# Patient Record
Sex: Female | Born: 1950 | Race: White | Hispanic: No | Smoking: Former smoker
Health system: Southern US, Community
[De-identification: ages and names within clinical notes are randomized; demographics above are authoritative.]

## PROBLEM LIST (undated history)

## (undated) DIAGNOSIS — M069 Rheumatoid arthritis, unspecified: Secondary | ICD-10-CM

## (undated) DIAGNOSIS — M51369 Other intervertebral disc degeneration, lumbar region without mention of lumbar back pain or lower extremity pain: Secondary | ICD-10-CM

## (undated) DIAGNOSIS — G5700 Lesion of sciatic nerve, unspecified lower limb: Secondary | ICD-10-CM

## (undated) DIAGNOSIS — M5136 Other intervertebral disc degeneration, lumbar region: Secondary | ICD-10-CM

## (undated) HISTORY — DX: Lesion of sciatic nerve, unspecified lower limb: G57.00

## (undated) HISTORY — DX: Other intervertebral disc degeneration, lumbar region: M51.36

## (undated) HISTORY — PX: TONSILLECTOMY: SUR1361

## (undated) HISTORY — DX: Rheumatoid arthritis, unspecified: M06.9

## (undated) HISTORY — DX: Other intervertebral disc degeneration, lumbar region without mention of lumbar back pain or lower extremity pain: M51.369

---

## 1998-04-22 ENCOUNTER — Other Ambulatory Visit: Admission: RE | Admit: 1998-04-22 | Discharge: 1998-04-22 | Payer: Self-pay | Admitting: *Deleted

## 2006-06-07 ENCOUNTER — Ambulatory Visit (HOSPITAL_COMMUNITY): Admission: RE | Admit: 2006-06-07 | Discharge: 2006-06-07 | Payer: Self-pay

## 2006-08-17 ENCOUNTER — Other Ambulatory Visit: Admission: RE | Admit: 2006-08-17 | Discharge: 2006-08-17 | Payer: Self-pay | Admitting: Internal Medicine

## 2006-08-23 ENCOUNTER — Encounter: Admission: RE | Admit: 2006-08-23 | Discharge: 2006-08-23 | Payer: Self-pay | Admitting: Internal Medicine

## 2007-04-01 ENCOUNTER — Encounter: Admission: RE | Admit: 2007-04-01 | Discharge: 2007-04-01 | Payer: Self-pay | Admitting: Internal Medicine

## 2008-03-31 ENCOUNTER — Encounter: Admission: RE | Admit: 2008-03-31 | Discharge: 2008-03-31 | Payer: Self-pay | Admitting: Internal Medicine

## 2008-11-30 ENCOUNTER — Encounter: Admission: RE | Admit: 2008-11-30 | Discharge: 2008-11-30 | Payer: Self-pay | Admitting: Internal Medicine

## 2011-07-20 ENCOUNTER — Other Ambulatory Visit (HOSPITAL_COMMUNITY)
Admission: RE | Admit: 2011-07-20 | Discharge: 2011-07-20 | Disposition: A | Discharge status: Home / Self Care | Payer: PRIVATE HEALTH INSURANCE | Source: Ambulatory Visit | Attending: Internal Medicine | Admitting: Internal Medicine

## 2011-07-20 DIAGNOSIS — Z01419 Encounter for gynecological examination (general) (routine) without abnormal findings: Secondary | ICD-10-CM | POA: Insufficient documentation

## 2011-07-20 DIAGNOSIS — Z1159 Encounter for screening for other viral diseases: Secondary | ICD-10-CM | POA: Insufficient documentation

## 2011-07-21 ENCOUNTER — Other Ambulatory Visit: Payer: Self-pay | Admitting: Internal Medicine

## 2011-07-21 DIAGNOSIS — Z1231 Encounter for screening mammogram for malignant neoplasm of breast: Secondary | ICD-10-CM

## 2011-08-22 ENCOUNTER — Ambulatory Visit
Admission: RE | Admit: 2011-08-22 | Discharge: 2011-08-22 | Disposition: A | Discharge status: Home / Self Care | Payer: PRIVATE HEALTH INSURANCE | Source: Ambulatory Visit | Attending: Internal Medicine | Admitting: Internal Medicine

## 2011-08-22 DIAGNOSIS — Z1231 Encounter for screening mammogram for malignant neoplasm of breast: Secondary | ICD-10-CM

## 2011-08-29 ENCOUNTER — Other Ambulatory Visit: Payer: Self-pay | Admitting: Internal Medicine

## 2011-08-29 DIAGNOSIS — R928 Other abnormal and inconclusive findings on diagnostic imaging of breast: Secondary | ICD-10-CM

## 2011-08-31 ENCOUNTER — Other Ambulatory Visit: Payer: Self-pay | Admitting: Internal Medicine

## 2011-08-31 DIAGNOSIS — E049 Nontoxic goiter, unspecified: Secondary | ICD-10-CM

## 2011-09-07 ENCOUNTER — Ambulatory Visit
Admission: RE | Admit: 2011-09-07 | Discharge: 2011-09-07 | Disposition: A | Discharge status: Home / Self Care | Payer: PRIVATE HEALTH INSURANCE | Source: Ambulatory Visit | Attending: Internal Medicine | Admitting: Internal Medicine

## 2011-09-07 DIAGNOSIS — R928 Other abnormal and inconclusive findings on diagnostic imaging of breast: Secondary | ICD-10-CM

## 2011-09-12 ENCOUNTER — Ambulatory Visit
Admission: RE | Admit: 2011-09-12 | Discharge: 2011-09-12 | Disposition: A | Discharge status: Home / Self Care | Payer: PRIVATE HEALTH INSURANCE | Source: Ambulatory Visit | Attending: Internal Medicine | Admitting: Internal Medicine

## 2011-09-12 DIAGNOSIS — E049 Nontoxic goiter, unspecified: Secondary | ICD-10-CM

## 2012-07-25 ENCOUNTER — Other Ambulatory Visit: Payer: Self-pay | Admitting: Internal Medicine

## 2012-07-25 DIAGNOSIS — Z1231 Encounter for screening mammogram for malignant neoplasm of breast: Secondary | ICD-10-CM

## 2012-08-22 ENCOUNTER — Ambulatory Visit
Admission: RE | Admit: 2012-08-22 | Discharge: 2012-08-22 | Disposition: A | Discharge status: Home / Self Care | Payer: PRIVATE HEALTH INSURANCE | Source: Ambulatory Visit | Attending: Internal Medicine | Admitting: Internal Medicine

## 2012-08-22 DIAGNOSIS — Z1231 Encounter for screening mammogram for malignant neoplasm of breast: Secondary | ICD-10-CM

## 2013-04-08 ENCOUNTER — Other Ambulatory Visit: Payer: Self-pay | Admitting: Internal Medicine

## 2013-04-08 DIAGNOSIS — M25562 Pain in left knee: Secondary | ICD-10-CM

## 2013-10-01 ENCOUNTER — Other Ambulatory Visit: Payer: Self-pay

## 2013-10-01 DIAGNOSIS — Z1231 Encounter for screening mammogram for malignant neoplasm of breast: Secondary | ICD-10-CM

## 2013-12-09 ENCOUNTER — Ambulatory Visit
Admission: RE | Admit: 2013-12-09 | Discharge: 2013-12-09 | Disposition: A | Discharge status: Home / Self Care | Payer: PRIVATE HEALTH INSURANCE | Source: Ambulatory Visit

## 2013-12-09 DIAGNOSIS — Z1231 Encounter for screening mammogram for malignant neoplasm of breast: Secondary | ICD-10-CM

## 2015-08-31 ENCOUNTER — Other Ambulatory Visit (HOSPITAL_COMMUNITY)
Admission: RE | Admit: 2015-08-31 | Discharge: 2015-08-31 | Disposition: A | Discharge status: Home / Self Care | Payer: PRIVATE HEALTH INSURANCE | Source: Ambulatory Visit | Attending: Internal Medicine | Admitting: Internal Medicine

## 2015-08-31 DIAGNOSIS — Z01419 Encounter for gynecological examination (general) (routine) without abnormal findings: Secondary | ICD-10-CM | POA: Diagnosis not present

## 2015-09-15 ENCOUNTER — Other Ambulatory Visit: Payer: Self-pay

## 2015-09-15 DIAGNOSIS — Z1231 Encounter for screening mammogram for malignant neoplasm of breast: Secondary | ICD-10-CM

## 2015-11-02 ENCOUNTER — Ambulatory Visit
Admission: RE | Admit: 2015-11-02 | Discharge: 2015-11-02 | Disposition: A | Discharge status: Home / Self Care | Payer: 59 | Source: Ambulatory Visit

## 2015-11-02 DIAGNOSIS — Z1231 Encounter for screening mammogram for malignant neoplasm of breast: Secondary | ICD-10-CM

## 2016-11-29 ENCOUNTER — Encounter: Payer: Self-pay | Admitting: Podiatry

## 2016-11-29 ENCOUNTER — Ambulatory Visit (INDEPENDENT_AMBULATORY_CARE_PROVIDER_SITE_OTHER): Payer: 59 | Admitting: Podiatry

## 2016-11-29 VITALS — BP 139/76 | HR 71 | Resp 16 | Ht 65.5 in | Wt 155.0 lb

## 2016-11-29 DIAGNOSIS — B351 Tinea unguium: Secondary | ICD-10-CM | POA: Diagnosis not present

## 2016-11-29 NOTE — Progress Notes (Signed)
   Subjective:    Patient ID: Pamela Pena, female    DOB: 15-Jun-1951, 66 y.o.   MRN: [id number]  HPI Chief Complaint  Patient presents with  . Nail Problem    Right foot; 3rd toe; nail discoloration; pt stated, "Has had for 3 weeks and wants to get checked for nail fungus"      Review of Systems  All other systems reviewed and are negative.      Objective:   Physical Exam        Assessment & Plan:

## 2016-11-29 NOTE — Progress Notes (Signed)
Subjective:     Patient ID: Pamela Pena, female   DOB: 1950/12/23, 66 y.o.   MRN: [id number]  HPI patient presents with discoloration in the third nail right on the lateral side with localized changes present   Review of Systems  All other systems reviewed and are negative.      Objective:   Physical Exam  Constitutional: She is oriented to person, place, and time.  Cardiovascular: Intact distal pulses.   Musculoskeletal: Normal range of motion.  Neurological: She is oriented to person, place, and time.  Skin: Skin is warm.  Nursing note and vitals reviewed.  neurovascular status intact muscle strength was adequate range of motion within normal limits with patient found to have discoloration of the distal lateral side digit 3 right with no proximal edema erythema or drainage noted. 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

## 2016-12-01 ENCOUNTER — Ambulatory Visit: Payer: Self-pay | Admitting: Podiatry

## 2017-01-05 ENCOUNTER — Encounter (INDEPENDENT_AMBULATORY_CARE_PROVIDER_SITE_OTHER): Payer: 59 | Admitting: Ophthalmology

## 2017-01-05 DIAGNOSIS — H43813 Vitreous degeneration, bilateral: Secondary | ICD-10-CM

## 2017-01-05 DIAGNOSIS — H33302 Unspecified retinal break, left eye: Secondary | ICD-10-CM

## 2017-01-05 DIAGNOSIS — H33021 Retinal detachment with multiple breaks, right eye: Secondary | ICD-10-CM | POA: Diagnosis not present

## 2017-01-05 DIAGNOSIS — H35371 Puckering of macula, right eye: Secondary | ICD-10-CM | POA: Diagnosis not present

## 2017-01-10 ENCOUNTER — Ambulatory Visit (INDEPENDENT_AMBULATORY_CARE_PROVIDER_SITE_OTHER): Payer: 59 | Admitting: Ophthalmology

## 2017-01-10 DIAGNOSIS — H33302 Unspecified retinal break, left eye: Secondary | ICD-10-CM

## 2017-01-17 ENCOUNTER — Ambulatory Visit (INDEPENDENT_AMBULATORY_CARE_PROVIDER_SITE_OTHER): Payer: 59 | Admitting: Ophthalmology

## 2017-01-17 DIAGNOSIS — H33303 Unspecified retinal break, bilateral: Secondary | ICD-10-CM

## 2017-05-21 ENCOUNTER — Ambulatory Visit (INDEPENDENT_AMBULATORY_CARE_PROVIDER_SITE_OTHER): Payer: 59 | Admitting: Ophthalmology

## 2017-05-21 DIAGNOSIS — H35371 Puckering of macula, right eye: Secondary | ICD-10-CM

## 2017-05-21 DIAGNOSIS — H43813 Vitreous degeneration, bilateral: Secondary | ICD-10-CM

## 2017-05-21 DIAGNOSIS — H33303 Unspecified retinal break, bilateral: Secondary | ICD-10-CM

## 2018-01-22 ENCOUNTER — Other Ambulatory Visit: Payer: Self-pay | Admitting: Internal Medicine

## 2018-01-22 DIAGNOSIS — Z1231 Encounter for screening mammogram for malignant neoplasm of breast: Secondary | ICD-10-CM

## 2018-02-07 ENCOUNTER — Encounter: Payer: Self-pay | Admitting: Radiology

## 2018-02-07 ENCOUNTER — Ambulatory Visit
Admission: RE | Admit: 2018-02-07 | Discharge: 2018-02-07 | Disposition: A | Discharge status: Home / Self Care | Payer: 59 | Source: Ambulatory Visit | Attending: Internal Medicine | Admitting: Internal Medicine

## 2018-02-07 DIAGNOSIS — Z1231 Encounter for screening mammogram for malignant neoplasm of breast: Secondary | ICD-10-CM

## 2018-02-25 ENCOUNTER — Ambulatory Visit (INDEPENDENT_AMBULATORY_CARE_PROVIDER_SITE_OTHER): Payer: 59 | Admitting: Neurology

## 2018-02-25 ENCOUNTER — Encounter: Payer: Self-pay | Admitting: Neurology

## 2018-02-25 VITALS — BP 153/82 | HR 70 | Ht 65.5 in | Wt 160.8 lb

## 2018-02-25 DIAGNOSIS — G629 Polyneuropathy, unspecified: Secondary | ICD-10-CM

## 2018-02-25 NOTE — Progress Notes (Signed)
[facility]    Provider:  Dr Jaynee Eagles Referring Provider: Deland Pretty, MD, Dr. Suella Broad Primary Care Physician:  Deland Pretty, MD  CC:  foot numbness  HPI:  Pamela Pena is a 67 y.o. female here as a referral from Dr. Shelia Media and Dr. Nelva Bush for bilateral foot numbness. PMHx degeneration of lumbar disc, piriformis syndrome, sacroiliac joint pain, numbness of foot, pain in right and left knee, rheumatoid arthritis. She has been having sciatic problems for a long time, after she started seeing Dr. Nelva Bush she noticed the tips of her toes were numb noticed it in bed, didn't think anything of it started in January of this year. Started slowly progressing to the rest of the foot. Now all of her toes are numb and into the ball of the feet, symmetric. No pain just numb. Continous, nothing makes it better or worse. No burning, shooting, tingling. She sees Dr. Amil Amen for her RA and is on methotrexate weekly and daily prednisone. She has been on methotrexate for 15 years. No long-term antibiotics, new medication, exposure to toxins. No family history of neuropathy.  Reviewed notes, labs and imaging from outside physicians, which showed:   Reviewed referring physician notes.  She is a former smoker but currently a non-smoker 6 years of tobacco use, no alcohol intake, she is seen for back pain she has had right SI joint injections with some relief.  She takes a rare Vicodin or naproxen and Tylenol.  She does have upcoming bilateral knee surgery scheduled 4 weeks apart.  At last appointment she started complaining about bilateral foot numbness.  She denies that it is painful.  No family or personal history of neuropathy.  No numbness or tingling in her hands.  Negative straight leg raise on the left, negative crossed straight leg raise on the right, excellent reflexes at the ankles, strength is 5 out of 5.  She has ongoing right thigh pain due to multilevel degenerative disc disease, rheumatoid  arthritis, bilateral knee osteoarthritis, bilateral foot numbness.  Nerve conduction study was ordered but results are not available in the consult and unclear if performed, will request records on any emg/ncs performed.    Review of Systems: Patient complains of symptoms per HPI as well as the following symptoms: numbness. Pertinent negatives and positives per HPI. All others negative.   Social History   Socioeconomic History  . Marital status: Widowed    Spouse name: Not on file  . Number of children: 2  . Years of education: 32  . Highest education level: Not on file  Occupational History  . Not on file  Social Needs  . Financial resource strain: Not on file  . Food insecurity:    Worry: Not on file    Inability: Not on file  . Transportation needs:    Medical: Not on file    Non-medical: Not on file  Tobacco Use  . Smoking status: Former Smoker    Last attempt to quit: 1974    Years since quitting: 45.2  . Smokeless tobacco: Never Used  Substance and Sexual Activity  . Alcohol use: Not Currently    Comment: quit 1974  . Drug use: Not Currently    Comment: some use for 5 years after high school.   . Sexual activity: Not on file  Lifestyle  . Physical activity:    Days per week: Not on file    Minutes per session: Not on file  . Stress: Not on file  Relationships  . Social connections:    Talks on phone: Not on file    Gets together: Not on file    Attends religious service: Not on file    Active member of club or organization: Not on file    Attends meetings of clubs or organizations: Not on file    Relationship status: Not on file  . Intimate partner violence:    Fear of current or ex partner: Not on file    Emotionally abused: Not on file    Physically abused: Not on file    Forced sexual activity: Not on file  Other Topics Concern  . Not on file  Social History Narrative   Lives at home. Her mother lives with her and she is her caretaker   Right handed     Drinks 2-4 cups of caffeine daily    Family History  Problem Relation Age of Onset  . Breast cancer Maternal Aunt   . CAD Mother   . Cancer Father   . Neuropathy Neg Hx     Past Medical History:  Diagnosis Date  . Disc degeneration, lumbar   . Piriformis syndrome   . RA (rheumatoid arthritis) ([place])     Past Surgical History:  Procedure Laterality Date  . CESAREAN SECTION  1970    Current Outpatient Medications  Medication Sig Dispense Refill  . acetaminophen (TYLENOL) 650 MG CR tablet Take 1,300 mg by mouth 2 (two) times daily.    . Ascorbic Acid (VITAMIN C PO) Take 1 tablet by mouth daily.    Marland Kitchen b complex vitamins tablet Take 1 tablet by mouth daily.    . Biotin (BIOTIN 5000) 5 MG CAPS Take 1 capsule by mouth daily.    . Calcium Carbonate-Vitamin D (CALCIUM PLUS VITAMIN D PO) Take 1 tablet by mouth daily.    . cyanocobalamin 1000 MCG tablet Take 1,000 mcg by mouth 2 (two) times daily.    Marland Kitchen HYDROcodone-acetaminophen (NORCO/VICODIN) 5-325 MG tablet Take 1 tablet by mouth every 6 (six) hours as needed for moderate pain. Pt stated, "Takes approximately 1 tablet per month"    . methotrexate (RHEUMATREX) 2.5 MG tablet Take 10 mg by mouth once a week. Monday- 4 tablets in AM and 4 tablets in PM    . Multiple Vitamins-Minerals (MULTIVITAMIN ADULTS PO) Take 1 tablet by mouth 2 (two) times daily.     . naproxen (NAPROSYN) 500 MG tablet Take 500 mg by mouth 2 (two) times daily with a meal.     . Omega-3 Fatty Acids (FISH OIL) 1200 MG CPDR Take 1 tablet by mouth daily.    Marland Kitchen OVER THE COUNTER MEDICATION B-6 (P5P Pyridoxol 5 Phosphate)  OTC supplements    . OVER THE COUNTER MEDICATION K2 (K-7 Maenaquinone- 7)  OTC supplement    . OVER THE COUNTER MEDICATION Herbavision (Lutein, Zeaxanthin, Bilberry Extract)    . polyethylene glycol powder (GLYCOLAX/MIRALAX) powder     . predniSONE (DELTASONE) 5 MG tablet Take 5 mg by mouth daily.     . TURMERIC PO Take 1,800 mg by mouth 2 (two) times  daily.    Marland Kitchen zolpidem (AMBIEN) 5 MG tablet Take 5 mg by mouth at bedtime.      No current facility-administered medications for this visit.     Allergies as of 02/25/2018  . (No Known Allergies)    Vitals: BP (!) 153/82 (BP Location: Right Arm, Patient Position: Sitting)   Pulse 70   Ht 5' 5.5" (1.664  m)   Wt 160 lb 12.8 oz (72.9 kg)   BMI 26.35 kg/m  Last Weight:  Wt Readings from Last 1 Encounters:  02/25/18 160 lb 12.8 oz (72.9 kg)   Last Height:   Ht Readings from Last 1 Encounters:  02/25/18 5' 5.5" (1.664 m)   Physical exam: Exam: Gen: NAD, conversant, well nourised,  well groomed                     CV: RRR, no MRG. No Carotid Bruits. No peripheral edema, warm, nontender Eyes: Conjunctivae clear without exudates or hemorrhage MSK: high arches in feet Neuro: Detailed Neurologic Exam  Speech:    Speech is normal; fluent and spontaneous with normal comprehension.  Cognition:    The patient is oriented to person, place, and time;     recent and remote memory intact;     language fluent;     normal attention, concentration,     fund of knowledge Cranial Nerves:    The pupils are equal, round, and reactive to light. Attempted fundoscopic exam could not visualize due to small pupils. . Extraocular movements are intact. Trigeminal sensation is intact and the muscles of mastication are normal. The face is symmetric. The palate elevates in the midline. Hearing intact. Voice is normal. Shoulder shrug is normal. The tongue has normal motion without fasciculations.   Coordination:    Normal finger to nose and heel to shin.   Gait:    Heel-toe and tandem gait are normal.   Motor Observation:    No asymmetry, no atrophy, and no involuntary movements noted. Tone:    Normal muscle tone.    Posture:    Posture is normal. normal erect    Strength:    Strength is V/V in the upper and lower limbs.      Sensation: intact to LT. Decrease pp and temp distally in the feet,  intact proprioception and vibration     Reflex Exam:  DTR's:    Deep tendon reflexes in the upper and lower extremities are brisk bilaterally including AJs.   Toes:   Right equiv, left downgoing    Clonus:    Clonus is absent.       Assessment/Plan:   67 y.o. female here as a referral from Dr. Shelia Media and Dr. 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 PMHx degeneration of lumbar disc, piriformis syndrome, sacroiliac joint pain, numbness of foot, pain in right and left knee, rheumatoid arthritis. Risk factors include autoimmune disease and long-term use of methotrexate but she is compliant with vitamin B supplementation.  - Discussed the causes of peripheral neuropathy, the most common being diabetes which patient does not report having. About 20 million people in the [place] states have some form of peripheral neuropathy. This is a condition that develops as a result of damage to the peripheral nervous system. 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 There are multiple causes eluding metabolic, toxic, infectious and endocrine disorders, small vessel disease, autoimmune diseases, and others. We'll perform extensive serum neuropathy screening. She is already seen by Rheumatology so will not perform any rheumatologic testing, asked her to also follow up with Dr. Amil Amen for symptoms as well.  - don't feel emg/ncs would give [place] any additional info at this time and is normal in pure small fiber neuropathy but can consider in the future  Orders Placed This Encounter  Procedures  . Hemoglobin A1c  . Vitamin B1  . Methylmalonic acid, serum  . TSH  . B12 and Folate Panel  . Tissue transglutaminase, IgA  . Gliadin antibodies, serum  . Heavy metals, blood  . Vitamin B6  . Multiple Myeloma Panel (SPEP&IFE w/QIG)    . Comprehensive metabolic panel  . CBC   Cc: Dr. Shelia Media and Dr. Campbell Riches, [facility] [address] [place], [place] [address]  Phone [phone] Fax [phone]

## 2018-02-25 NOTE — Patient Instructions (Signed)
Peripheral Neuropathy Peripheral neuropathy is a type of nerve damage. It affects nerves that carry signals between the spinal cord and other parts of the body. These are called peripheral nerves. With peripheral neuropathy, one nerve or a group of nerves may be damaged. What are the causes? Many things can damage peripheral nerves. For some people with peripheral neuropathy, the cause is unknown. Some causes include:  Diabetes. This is the most common cause of peripheral neuropathy.  Injury to a nerve.  Pressure or stress on a nerve that lasts a long time.  Too little vitamin B. Alcoholism can lead to this.  Infections.  Autoimmune diseases, such as multiple sclerosis, Rheumatoid Arthritis and systemic lupus erythematosus.  Inherited nerve diseases.  Some medicines, such as cancer drugs.  Toxic substances, such as lead and mercury.  Too little blood flowing to the legs.  Kidney disease.  Thyroid disease.  What are the signs or symptoms? Different people have different symptoms. The symptoms you have will depend on which of your nerves is damaged. Common symptoms include:  Loss of feeling (numbness) in the feet and hands.  Tingling in the feet and hands.  Pain that burns.  Very sensitive skin.  Weakness.  Not being able to move a part of the body (paralysis).  Muscle twitching.  Clumsiness or poor coordination.  Loss of balance.  Not being able to control your bladder.  Feeling dizzy.  Sexual problems.  How is this diagnosed? Peripheral neuropathy is a symptom, not a disease. Finding the cause of peripheral neuropathy can be hard. To figure that out, your health care provider will take a medical history and do a physical exam. A neurological exam will also be done. This involves checking things affected by your brain, spinal cord, and nerves (nervous system). For example, your health care provider will check your reflexes, how you move, and what you can  feel. Other types of tests may also be ordered, such as:  Blood tests.  A test of the fluid in your spinal cord.  Imaging tests, such as CT scans or an MRI.  Electromyography (EMG). This test checks the nerves that control muscles.  Nerve conduction velocity tests. These tests check how fast messages pass through your nerves.  Nerve biopsy. A small piece of nerve is removed. It is then checked under a microscope.  How is this treated?  Medicine is often used to treat peripheral neuropathy. Medicines may include: ? Pain-relieving medicines. Prescription or over-the-counter medicine may be suggested. ? Antiseizure medicine. This may be used for pain. ? Antidepressants. These also may help ease pain from neuropathy. ? Lidocaine. This is a numbing medicine. You might wear a patch or be given a shot. ? Mexiletine. This medicine is typically used to help control irregular heart rhythms.  Surgery. Surgery may be needed to relieve pressure on a nerve or to destroy a nerve that is causing pain.  Physical therapy to help movement.  Assistive devices to help movement. Follow these instructions at home:  Only take over-the-counter or prescription medicines as directed by your health care provider. Follow the instructions carefully for any given medicines. Do not take any other medicines without first getting approval from your health care provider.  If you have diabetes, work closely with your health care provider to keep your blood sugar under control.  If you have numbness in your feet: ? Check every day for signs of injury or infection. Watch for redness, warmth, and swelling. ? Wear padded socks  and comfortable shoes. These help protect your feet.  Do not do things that put pressure on your damaged nerve.  Do not smoke. Smoking keeps blood from getting to damaged nerves.  Avoid or limit alcohol. Too much alcohol can cause a lack of B vitamins. These vitamins are needed for healthy  nerves.  Develop a good support system. Coping with peripheral neuropathy can be stressful. Talk to a mental health specialist or join a support group if you are struggling.  Follow up with your health care provider as directed. Contact a health care provider if:  You have new signs or symptoms of peripheral neuropathy.  You are struggling emotionally from dealing with peripheral neuropathy.  You have a fever. Get help right away if:  You have an injury or infection that is not healing.  You feel very dizzy or begin vomiting.  You have chest pain.  You have trouble breathing. This information is not intended to replace advice given to you by your health care provider. Make sure you discuss any questions you have with your health care provider. Document Released: 11/03/2002 Document Revised: 04/20/2016 Document Reviewed: 07/21/2013 Elsevier Interactive Patient Education  2017 [employer].

## 2018-02-28 ENCOUNTER — Telehealth: Payer: Self-pay | Admitting: *Deleted

## 2018-02-28 LAB — MULTIPLE MYELOMA PANEL, SERUM
ALBUMIN/GLOB SERPL: 1.8 — AB (ref 0.7–1.7)
ALPHA 1: 0.3 g/dL (ref 0.0–0.4)
ALPHA2 GLOB SERPL ELPH-MCNC: 0.6 g/dL (ref 0.4–1.0)
Albumin SerPl Elph-Mcnc: 4.1 g/dL (ref 2.9–4.4)
B-Globulin SerPl Elph-Mcnc: 1 g/dL (ref 0.7–1.3)
Gamma Glob SerPl Elph-Mcnc: 0.4 g/dL (ref 0.4–1.8)
Globulin, Total: 2.3 g/dL (ref 2.2–3.9)
IGA/IMMUNOGLOBULIN A, SERUM: 91 mg/dL (ref 87–352)
IGM (IMMUNOGLOBULIN M), SRM: 35 mg/dL (ref 26–217)
IgG (Immunoglobin G), Serum: 461 mg/dL — ABNORMAL LOW (ref 700–1600)

## 2018-02-28 LAB — HEMOGLOBIN A1C
ESTIMATED AVERAGE GLUCOSE: 103 mg/dL
HEMOGLOBIN A1C: 5.2 % (ref 4.8–5.6)

## 2018-02-28 LAB — METHYLMALONIC ACID, SERUM: METHYLMALONIC ACID: 74 nmol/L (ref 0–378)

## 2018-02-28 LAB — CBC
HEMOGLOBIN: 13.6 g/dL (ref 11.1–15.9)
Hematocrit: 43.1 % (ref 34.0–46.6)
MCH: 33.5 pg — AB (ref 26.6–33.0)
MCHC: 31.6 g/dL (ref 31.5–35.7)
MCV: 106 fL — ABNORMAL HIGH (ref 79–97)
Platelets: 252 10*3/uL (ref 150–379)
RBC: 4.06 x10E6/uL (ref 3.77–5.28)
RDW: 15.3 % (ref 12.3–15.4)
WBC: 6.2 10*3/uL (ref 3.4–10.8)

## 2018-02-28 LAB — COMPREHENSIVE METABOLIC PANEL
A/G RATIO: 2.6 — AB (ref 1.2–2.2)
ALBUMIN: 4.6 g/dL (ref 3.6–4.8)
ALT: 24 IU/L (ref 0–32)
AST: 28 IU/L (ref 0–40)
Alkaline Phosphatase: 94 IU/L (ref 39–117)
BILIRUBIN TOTAL: 0.3 mg/dL (ref 0.0–1.2)
BUN / CREAT RATIO: 24 (ref 12–28)
BUN: 17 mg/dL (ref 8–27)
CALCIUM: 9.8 mg/dL (ref 8.7–10.3)
CHLORIDE: 101 mmol/L (ref 96–106)
CO2: 20 mmol/L (ref 20–29)
Creatinine, Ser: 0.72 mg/dL (ref 0.57–1.00)
GFR, EST AFRICAN AMERICAN: 100 mL/min/{1.73_m2} (ref >59)
GFR, EST NON AFRICAN AMERICAN: 86 mL/min/{1.73_m2} (ref >59)
GLOBULIN, TOTAL: 1.8 g/dL (ref 1.5–4.5)
Glucose: 89 mg/dL (ref 65–99)
POTASSIUM: 4.5 mmol/L (ref 3.5–5.2)
Sodium: 145 mmol/L — ABNORMAL HIGH (ref 134–144)
TOTAL PROTEIN: 6.4 g/dL (ref 6.0–8.5)

## 2018-02-28 LAB — HEAVY METALS, BLOOD
ARSENIC: 6 ug/L (ref 2–23)
LEAD, BLOOD: 1 ug/dL (ref 0–4)
MERCURY: NOT DETECTED ug/L (ref 0.0–14.9)

## 2018-02-28 LAB — B12 AND FOLATE PANEL: Vitamin B-12: 1271 pg/mL — ABNORMAL HIGH (ref 232–1245)

## 2018-02-28 LAB — VITAMIN B6: VITAMIN B6: 99.3 ug/L — AB (ref 2.0–32.8)

## 2018-02-28 LAB — GLIADIN ANTIBODIES, SERUM
Antigliadin Abs, IgA: 3 units (ref 0–19)
GLIADIN IGG: 3 U (ref 0–19)

## 2018-02-28 LAB — TISSUE TRANSGLUTAMINASE, IGA

## 2018-02-28 LAB — VITAMIN B1: Thiamine: 202.5 nmol/L — ABNORMAL HIGH (ref 66.5–200.0)

## 2018-02-28 LAB — TSH: TSH: 0.369 u[IU]/mL — ABNORMAL LOW (ref 0.450–4.500)

## 2018-02-28 NOTE — Telephone Encounter (Signed)
Spoke with patient and informed her that her labs showed her vitamin B6 is elevated, and advised her taking too much B6 can be toxic. This RN asked if she is taking OTC B6. She is taking pyridoxal phosphate (form of B6) 50 mg daily. This RN advised Dr Lucia Gaskins stated to make sure it is less than 100 mg a day. Otherwise her labs are unremarkable. Patient asked what a high level could cause; this RN advised neuropathy. Patient asked if she should stop, has only been taking a few months. This RN suggested she could begin taking it every other day to see how she felt.  She agreed, stated she is seeing her PCP before she sees Dr Lucia Gaskins again and she'll ask that it be rechecked. She verbalized understanding, appreciation of call.

## 2018-04-26 ENCOUNTER — Encounter (INDEPENDENT_AMBULATORY_CARE_PROVIDER_SITE_OTHER): Payer: 59 | Admitting: Ophthalmology

## 2018-04-26 DIAGNOSIS — H43813 Vitreous degeneration, bilateral: Secondary | ICD-10-CM | POA: Diagnosis not present

## 2018-04-26 DIAGNOSIS — H33303 Unspecified retinal break, bilateral: Secondary | ICD-10-CM | POA: Diagnosis not present

## 2018-04-26 DIAGNOSIS — H3561 Retinal hemorrhage, right eye: Secondary | ICD-10-CM | POA: Diagnosis not present

## 2018-04-26 DIAGNOSIS — H35371 Puckering of macula, right eye: Secondary | ICD-10-CM

## 2018-05-02 ENCOUNTER — Encounter (INDEPENDENT_AMBULATORY_CARE_PROVIDER_SITE_OTHER): Payer: 59 | Admitting: Ophthalmology

## 2018-05-09 ENCOUNTER — Encounter (INDEPENDENT_AMBULATORY_CARE_PROVIDER_SITE_OTHER): Payer: 59 | Admitting: Ophthalmology

## 2018-06-03 ENCOUNTER — Encounter (INDEPENDENT_AMBULATORY_CARE_PROVIDER_SITE_OTHER): Payer: 59 | Admitting: Ophthalmology

## 2018-06-03 DIAGNOSIS — H43813 Vitreous degeneration, bilateral: Secondary | ICD-10-CM | POA: Diagnosis not present

## 2018-06-03 DIAGNOSIS — H35371 Puckering of macula, right eye: Secondary | ICD-10-CM | POA: Diagnosis not present

## 2018-06-03 DIAGNOSIS — H33303 Unspecified retinal break, bilateral: Secondary | ICD-10-CM

## 2018-06-06 ENCOUNTER — Other Ambulatory Visit: Payer: Self-pay

## 2018-06-06 ENCOUNTER — Encounter (HOSPITAL_COMMUNITY): Payer: Self-pay | Admitting: *Deleted

## 2018-06-07 ENCOUNTER — Encounter (HOSPITAL_COMMUNITY)
Admission: RE | Admit: 2018-06-07 | Discharge: 2018-06-07 | Disposition: A | Discharge status: Home / Self Care | Payer: 59 | Source: Ambulatory Visit | Attending: Orthopedic Surgery | Admitting: Orthopedic Surgery

## 2018-06-07 DIAGNOSIS — Z01812 Encounter for preprocedural laboratory examination: Secondary | ICD-10-CM | POA: Insufficient documentation

## 2018-06-07 DIAGNOSIS — M1711 Unilateral primary osteoarthritis, right knee: Secondary | ICD-10-CM | POA: Insufficient documentation

## 2018-06-07 LAB — SURGICAL PCR SCREEN
MRSA, PCR: NEGATIVE
STAPHYLOCOCCUS AUREUS: NEGATIVE

## 2018-06-07 LAB — ABO/RH: ABO/RH(D): O NEG

## 2018-06-07 NOTE — Progress Notes (Signed)
05/24/2018-Labs on chart from [facility]- CBC w/diff., CMP, PT/INR

## 2018-06-09 MED ORDER — SODIUM CHLORIDE 0.9 % IV SOLN
1000.0000 mg | INTRAVENOUS | Status: AC
  Administered 2018-06-10: 1000 mg via INTRAVENOUS
  Filled 2018-06-09: qty 1100

## 2018-06-09 NOTE — H&P (Signed)
TOTAL KNEE ADMISSION H&P  Patient is being admitted for right total knee arthroplasty.  Subjective:  Chief Complaint: Right knee primary OA / pain  HPI: Pamela Pena, 67 y.o. female, has a history of pain and functional disability in the right knee due to arthritis and has failed non-surgical conservative treatments for greater than 12 weeks to include NSAID's and/or analgesics, corticosteriod injections and activity modification. Onset of symptoms was gradual, starting 3-4 years ago with gradually worsening course since that time. The patient noted no past surgery on the right knee(s). Patient currently rates pain in the right knee(s) at 8 out of 10 with activity. Patient has night pain, worsening of pain with activity and weight bearing, pain that interferes with activities of daily living, pain with passive range of motion, crepitus and joint swelling. Patient has evidence of periarticular osteophytes and joint space narrowing by imaging studies. There is no active infection. Risks, benefits and expectations were discussed with the patient. Risks including but not limited to the risk of anesthesia, blood clots, nerve damage, blood vessel damage, failure of the prosthesis, infection and up to and including death. Patient understand the risks, benefits and expectations and wishes to proceed with surgery.   D/C Plans: Home   Post-op Meds: No Rx given   Tranexamic Acid: To be given - IV / oral  Decadron: Is to be given  FYI:  ASA  Norco  DME: Pt already has equipment  PT: HHPT --> OPPT    Patient Active Problem List   Diagnosis Date Noted  . Small fiber neuropathy 02/25/2018   Past Medical History:  Diagnosis Date  . Disc degeneration, lumbar   . Piriformis syndrome   . RA (rheumatoid arthritis) (HCC)     Past Surgical History:  Procedure Laterality Date  . CESAREAN SECTION  1970  . TONSILLECTOMY     age 43    No current facility-administered medications for this encounter.     Current Outpatient Medications  Medication Sig Dispense Refill Last Dose  . acetaminophen (TYLENOL) 650 MG CR tablet Take 1,300 mg by mouth 2 (two) times daily.   Taking  . methotrexate (RHEUMATREX) 2.5 MG tablet Take 7.5 mg by mouth See admin instructions. Take 7.5 mg by mouth twice daily once weekly on Monday   Taking  . predniSONE (DELTASONE) 5 MG tablet Take 5 mg by mouth daily.    Taking  . zolpidem (AMBIEN) 5 MG tablet Take 5 mg by mouth at bedtime.    Taking  . Ascorbic Acid (VITAMIN C PO) Take 1 tablet by mouth 2 (two) times daily.    Taking  . Biotin (BIOTIN 5000) 5 MG CAPS Take 5 mg by mouth daily.    Taking  . Calcium Carbonate (CALCIUM 600 PO) Take 600 mg by mouth 2 (two) times daily.     . Cholecalciferol (VITAMIN D3) 5000 units CAPS Take 5,000 Units by mouth daily.     . cyanocobalamin 1000 MCG tablet Take 1,000 mcg by mouth 2 (two) times daily.   Taking  . folic acid (FOLVITE) 400 MCG tablet Take 400 mcg by mouth daily.     . Magnesium 500 MG CAPS Take 500 mg by mouth daily.     . Melatonin 3 MG CAPS Take 3 mg by mouth at bedtime.     . naproxen (NAPROSYN) 500 MG tablet Take 500 mg by mouth 2 (two) times daily with a meal.    Taking  . Omega-3 Fatty Acids (FISH OIL)  1000 MG CAPS Take 1,000 mg by mouth 2 (two) times daily.    Taking  . OVER THE COUNTER MEDICATION Take 1 capsule by mouth daily. Herbavision (Lutein, Zeaxanthin, Bilberry Extract)    Taking  . TURMERIC PO Take 1,800 mg by mouth 2 (two) times daily.   Taking  . vitamin E 400 UNIT capsule Take 400 Units by mouth daily.      No Known Allergies   Social History   Tobacco Use  . Smoking status: Former Smoker    Last attempt to quit: 1974    Years since quitting: 45.5  . Smokeless tobacco: Never Used  Substance Use Topics  . Alcohol use: Not Currently    Comment: quit 1974    Family History  Problem Relation Age of Onset  . Breast cancer Maternal Aunt   . CAD Mother   . Cancer Father   . Neuropathy Neg  Hx      Review of Systems  Constitutional: Negative.   HENT: Negative.   Eyes: Negative.   Respiratory: Negative.   Cardiovascular: Negative.   Gastrointestinal: Negative.   Genitourinary: Negative.   Musculoskeletal: Positive for joint pain.  Skin: Negative.   Neurological: Negative.   Endo/Heme/Allergies: Negative.   Psychiatric/Behavioral: Negative.     Objective:  Physical Exam  Constitutional: She is oriented to person, place, and time. She appears well-developed.  HENT:  Head: Normocephalic.  Eyes: Pupils are equal, round, and reactive to light.  Neck: Neck supple. No JVD present. No tracheal deviation present. No thyromegaly present.  Cardiovascular: Normal rate, regular rhythm and intact distal pulses.  Respiratory: Effort normal and breath sounds normal. No respiratory distress. She has no wheezes.  GI: Soft. There is no tenderness. There is no guarding.  Musculoskeletal:       Right knee: She exhibits decreased range of motion, swelling and bony tenderness. She exhibits no ecchymosis, no deformity, no laceration and no erythema. Tenderness found.  Lymphadenopathy:    She has no cervical adenopathy.  Neurological: She is alert and oriented to person, place, and time. A sensory deficit (neuropathy in bilateral feet) is present.  Skin: Skin is warm and dry.  Psychiatric: She has a normal mood and affect.      Labs:  Estimated body mass index is 26.35 kg/m as calculated from the following:   Height as of 02/25/18: 5' 5.5" (1.664 m).   Weight as of 02/25/18: 72.9 kg (160 lb 12.8 oz).   Imaging Review Plain radiographs demonstrate severe degenerative joint disease of the right knee(s). The bone quality appears to be excellent for age and reported activity level.   Preoperative templating of the joint replacement has been completed, documented, and submitted to the Operating Room personnel in order to optimize intra-operative equipment management.    Patient's  anticipated LOS is less than 2 midnights, meeting these requirements: - Younger than 58 - Lives within 1 hour of care - Has a competent adult at home to recover with post-op recover - NO history of  - Chronic pain requiring opiods  - Diabetes  - Coronary Artery Disease  - Heart failure  - Heart attack  - Stroke  - DVT/VTE  - Cardiac arrhythmia  - Respiratory Failure/COPD  - Renal failure  - Anemia  - Advanced Liver disease        Assessment/Plan:  End stage arthritis, right knee   The patient history, physical examination, clinical judgment of the provider and imaging studies are consistent with end  stage degenerative joint disease of the right knee(s) and total knee arthroplasty is deemed medically necessary. The treatment options including medical management, injection therapy arthroscopy and arthroplasty were discussed at length. The risks and benefits of total knee arthroplasty were presented and reviewed. The risks due to aseptic loosening, infection, stiffness, patella tracking problems, thromboembolic complications and other imponderables were discussed. The patient acknowledged the explanation, agreed to proceed with the plan and consent was signed. Patient is being admitted for inpatient treatment for surgery, pain control, PT, OT, prophylactic antibiotics, VTE prophylaxis, progressive ambulation and ADL's and discharge planning. The patient is planning to be discharged home.     Anastasio Auerbach Macallan Ord   PA-C  06/09/2018, 9:59 AM

## 2018-06-10 ENCOUNTER — Other Ambulatory Visit: Payer: Self-pay

## 2018-06-10 ENCOUNTER — Encounter (HOSPITAL_COMMUNITY): Payer: Self-pay | Admitting: Certified Registered Nurse Anesthetist

## 2018-06-10 ENCOUNTER — Ambulatory Visit (HOSPITAL_COMMUNITY): Payer: 59 | Admitting: Certified Registered Nurse Anesthetist

## 2018-06-10 ENCOUNTER — Observation Stay (HOSPITAL_COMMUNITY)
Admission: RE | Admit: 2018-06-10 | Discharge: 2018-06-11 | Disposition: A | Discharge status: Home / Self Care | Payer: 59 | Source: Ambulatory Visit | Attending: Orthopedic Surgery | Admitting: Orthopedic Surgery

## 2018-06-10 ENCOUNTER — Encounter (HOSPITAL_COMMUNITY)
Admission: RE | Disposition: A | Discharge status: Home / Self Care | Payer: Self-pay | Source: Ambulatory Visit | Attending: Orthopedic Surgery

## 2018-06-10 DIAGNOSIS — Z87891 Personal history of nicotine dependence: Secondary | ICD-10-CM | POA: Insufficient documentation

## 2018-06-10 DIAGNOSIS — M069 Rheumatoid arthritis, unspecified: Secondary | ICD-10-CM | POA: Insufficient documentation

## 2018-06-10 DIAGNOSIS — M1711 Unilateral primary osteoarthritis, right knee: Principal | ICD-10-CM | POA: Insufficient documentation

## 2018-06-10 DIAGNOSIS — Z79899 Other long term (current) drug therapy: Secondary | ICD-10-CM | POA: Diagnosis not present

## 2018-06-10 DIAGNOSIS — Z96659 Presence of unspecified artificial knee joint: Secondary | ICD-10-CM

## 2018-06-10 DIAGNOSIS — G5700 Lesion of sciatic nerve, unspecified lower limb: Secondary | ICD-10-CM | POA: Insufficient documentation

## 2018-06-10 DIAGNOSIS — Z7982 Long term (current) use of aspirin: Secondary | ICD-10-CM | POA: Insufficient documentation

## 2018-06-10 DIAGNOSIS — Z96651 Presence of right artificial knee joint: Secondary | ICD-10-CM

## 2018-06-10 HISTORY — PX: TOTAL KNEE ARTHROPLASTY: SHX125

## 2018-06-10 LAB — TYPE AND SCREEN
ABO/RH(D): O NEG
ANTIBODY SCREEN: NEGATIVE

## 2018-06-10 SURGERY — ARTHROPLASTY, KNEE, TOTAL
Anesthesia: Monitor Anesthesia Care | Site: Knee | Laterality: Right

## 2018-06-10 MED ORDER — METHOCARBAMOL 500 MG PO TABS
500.0000 mg | ORAL_TABLET | Freq: Four times a day (QID) | ORAL | Status: DC | PRN
  Filled 2018-06-10: qty 1

## 2018-06-10 MED ORDER — BUPIVACAINE HCL (PF) 0.75 % IJ SOLN
INTRAMUSCULAR | Status: DC | PRN
  Administered 2018-06-10: 1.8 mL via INTRATHECAL

## 2018-06-10 MED ORDER — PROPOFOL 10 MG/ML IV BOLUS
INTRAVENOUS | Status: AC
  Filled 2018-06-10: qty 60

## 2018-06-10 MED ORDER — SODIUM CHLORIDE 0.9 % IV SOLN
INTRAVENOUS | Status: DC
  Administered 2018-06-10: 16:00:00 via INTRAVENOUS

## 2018-06-10 MED ORDER — CEFAZOLIN SODIUM-DEXTROSE 2-4 GM/100ML-% IV SOLN
2.0000 g | Freq: Four times a day (QID) | INTRAVENOUS | Status: AC
  Administered 2018-06-10 – 2018-06-11 (×2): 2 g via INTRAVENOUS
  Filled 2018-06-10 (×2): qty 100

## 2018-06-10 MED ORDER — ONDANSETRON HCL 4 MG PO TABS: 4.0000 mg | ORAL_TABLET | Freq: Four times a day (QID) | ORAL | Status: DC | PRN

## 2018-06-10 MED ORDER — DOCUSATE SODIUM 100 MG PO CAPS: 100.0000 mg | ORAL_CAPSULE | Freq: Two times a day (BID) | ORAL | 0 refills | Status: DC

## 2018-06-10 MED ORDER — CEFAZOLIN SODIUM-DEXTROSE 2-4 GM/100ML-% IV SOLN
2.0000 g | INTRAVENOUS | Status: AC
  Administered 2018-06-10: 2 g via INTRAVENOUS
  Filled 2018-06-10: qty 100

## 2018-06-10 MED ORDER — ASPIRIN 81 MG PO CHEW: 81.0000 mg | CHEWABLE_TABLET | Freq: Two times a day (BID) | ORAL | 0 refills | Status: AC

## 2018-06-10 MED ORDER — POLYETHYLENE GLYCOL 3350 17 G PO PACK: 17.0000 g | PACK | Freq: Two times a day (BID) | ORAL | 0 refills | Status: DC

## 2018-06-10 MED ORDER — TRANEXAMIC ACID 1000 MG/10ML IV SOLN
1000.0000 mg | Freq: Once | INTRAVENOUS | Status: AC
  Administered 2018-06-10: 1000 mg via INTRAVENOUS
  Filled 2018-06-10: qty 1100

## 2018-06-10 MED ORDER — SODIUM CHLORIDE 0.9 % IR SOLN
Status: DC | PRN
  Administered 2018-06-10: 1000 mL

## 2018-06-10 MED ORDER — LACTATED RINGERS IV SOLN
INTRAVENOUS | Status: DC
  Administered 2018-06-10 (×2): via INTRAVENOUS

## 2018-06-10 MED ORDER — PHENYLEPHRINE 40 MCG/ML (10ML) SYRINGE FOR IV PUSH (FOR BLOOD PRESSURE SUPPORT)
PREFILLED_SYRINGE | INTRAVENOUS | Status: AC
  Filled 2018-06-10: qty 10

## 2018-06-10 MED ORDER — BISACODYL 10 MG RE SUPP: 10.0000 mg | Freq: Every day | RECTAL | Status: DC | PRN

## 2018-06-10 MED ORDER — METHOCARBAMOL 500 MG PO TABS: 500.0000 mg | ORAL_TABLET | Freq: Four times a day (QID) | ORAL | 0 refills | Status: DC | PRN

## 2018-06-10 MED ORDER — PHENYLEPHRINE 40 MCG/ML (10ML) SYRINGE FOR IV PUSH (FOR BLOOD PRESSURE SUPPORT)
PREFILLED_SYRINGE | INTRAVENOUS | Status: DC | PRN
  Administered 2018-06-10 (×4): 80 ug via INTRAVENOUS

## 2018-06-10 MED ORDER — ZOLPIDEM TARTRATE 5 MG PO TABS
5.0000 mg | ORAL_TABLET | Freq: Every day | ORAL | Status: DC
  Administered 2018-06-10: 5 mg via ORAL
  Filled 2018-06-10: qty 1

## 2018-06-10 MED ORDER — ONDANSETRON HCL 4 MG/2ML IJ SOLN
INTRAMUSCULAR | Status: DC | PRN
  Administered 2018-06-10: 4 mg via INTRAVENOUS

## 2018-06-10 MED ORDER — ONDANSETRON HCL 4 MG/2ML IJ SOLN
4.0000 mg | Freq: Four times a day (QID) | INTRAMUSCULAR | Status: DC | PRN
  Administered 2018-06-11: 4 mg via INTRAVENOUS
  Filled 2018-06-10: qty 2

## 2018-06-10 MED ORDER — CHLORHEXIDINE GLUCONATE 4 % EX LIQD: 60.0000 mL | Freq: Once | CUTANEOUS | Status: DC

## 2018-06-10 MED ORDER — 0.9 % SODIUM CHLORIDE (POUR BTL) OPTIME
TOPICAL | Status: DC | PRN
  Administered 2018-06-10: 1000 mL

## 2018-06-10 MED ORDER — DEXAMETHASONE SODIUM PHOSPHATE 10 MG/ML IJ SOLN
10.0000 mg | Freq: Once | INTRAMUSCULAR | Status: AC
  Administered 2018-06-10: 10 mg via INTRAVENOUS

## 2018-06-10 MED ORDER — POLYETHYLENE GLYCOL 3350 17 G PO PACK
17.0000 g | PACK | Freq: Two times a day (BID) | ORAL | Status: DC
  Administered 2018-06-10 – 2018-06-11 (×2): 17 g via ORAL
  Filled 2018-06-10 (×2): qty 1

## 2018-06-10 MED ORDER — FERROUS SULFATE 325 (65 FE) MG PO TABS
325.0000 mg | ORAL_TABLET | Freq: Two times a day (BID) | ORAL | Status: DC
  Administered 2018-06-11 (×2): 325 mg via ORAL
  Filled 2018-06-10 (×2): qty 1

## 2018-06-10 MED ORDER — FENTANYL CITRATE (PF) 100 MCG/2ML IJ SOLN
50.0000 ug | INTRAMUSCULAR | Status: DC
  Administered 2018-06-10: 100 ug via INTRAVENOUS

## 2018-06-10 MED ORDER — BUPIVACAINE-EPINEPHRINE (PF) 0.25% -1:200000 IJ SOLN
INTRAMUSCULAR | Status: AC
  Filled 2018-06-10: qty 30

## 2018-06-10 MED ORDER — BUPIVACAINE-EPINEPHRINE (PF) 0.5% -1:200000 IJ SOLN
INTRAMUSCULAR | Status: DC | PRN
  Administered 2018-06-10: 30 mL via PERINEURAL

## 2018-06-10 MED ORDER — SODIUM CHLORIDE 0.9 % IJ SOLN
INTRAMUSCULAR | Status: DC | PRN
  Administered 2018-06-10: 30 mL

## 2018-06-10 MED ORDER — HYDROMORPHONE HCL 1 MG/ML IJ SOLN: 0.2500 mg | INTRAMUSCULAR | Status: DC | PRN

## 2018-06-10 MED ORDER — PROPOFOL 10 MG/ML IV BOLUS
INTRAVENOUS | Status: DC | PRN
  Administered 2018-06-10 (×2): 20 mg via INTRAVENOUS

## 2018-06-10 MED ORDER — DEXAMETHASONE SODIUM PHOSPHATE 10 MG/ML IJ SOLN
10.0000 mg | Freq: Once | INTRAMUSCULAR | Status: AC
  Administered 2018-06-11: 10 mg via INTRAVENOUS
  Filled 2018-06-10: qty 1

## 2018-06-10 MED ORDER — MIDAZOLAM HCL 2 MG/2ML IJ SOLN
1.0000 mg | INTRAMUSCULAR | Status: DC
  Administered 2018-06-10: 2 mg via INTRAVENOUS

## 2018-06-10 MED ORDER — EPHEDRINE SULFATE-NACL 50-0.9 MG/10ML-% IV SOSY
PREFILLED_SYRINGE | INTRAVENOUS | Status: DC | PRN
  Administered 2018-06-10 (×3): 5 mg via INTRAVENOUS

## 2018-06-10 MED ORDER — PROPOFOL 500 MG/50ML IV EMUL
INTRAVENOUS | Status: DC | PRN
  Administered 2018-06-10: 100 ug/kg/min via INTRAVENOUS

## 2018-06-10 MED ORDER — DIPHENHYDRAMINE HCL 12.5 MG/5ML PO ELIX: 12.5000 mg | ORAL_SOLUTION | ORAL | Status: DC | PRN

## 2018-06-10 MED ORDER — MENTHOL 3 MG MT LOZG: 1.0000 | LOZENGE | OROMUCOSAL | Status: DC | PRN

## 2018-06-10 MED ORDER — ASPIRIN 81 MG PO CHEW
81.0000 mg | CHEWABLE_TABLET | Freq: Two times a day (BID) | ORAL | Status: DC
  Administered 2018-06-10 – 2018-06-11 (×2): 81 mg via ORAL
  Filled 2018-06-10 (×2): qty 1

## 2018-06-10 MED ORDER — KETOROLAC TROMETHAMINE 30 MG/ML IJ SOLN
INTRAMUSCULAR | Status: DC | PRN
  Administered 2018-06-10: 30 mg

## 2018-06-10 MED ORDER — PREDNISONE 5 MG PO TABS
5.0000 mg | ORAL_TABLET | Freq: Every day | ORAL | Status: DC
  Administered 2018-06-11: 5 mg via ORAL
  Filled 2018-06-10: qty 1

## 2018-06-10 MED ORDER — HYDROCODONE-ACETAMINOPHEN 7.5-325 MG PO TABS: 1.0000 | ORAL_TABLET | ORAL | 0 refills | Status: DC | PRN

## 2018-06-10 MED ORDER — ALUM & MAG HYDROXIDE-SIMETH 200-200-20 MG/5ML PO SUSP: 15.0000 mL | ORAL | Status: DC | PRN

## 2018-06-10 MED ORDER — MAGNESIUM CITRATE PO SOLN: 1.0000 | Freq: Once | ORAL | Status: DC | PRN

## 2018-06-10 MED ORDER — ACETAMINOPHEN 325 MG PO TABS: 325.0000 mg | ORAL_TABLET | Freq: Four times a day (QID) | ORAL | Status: DC | PRN

## 2018-06-10 MED ORDER — MORPHINE SULFATE (PF) 2 MG/ML IV SOLN
0.5000 mg | INTRAVENOUS | Status: DC | PRN
  Administered 2018-06-10: 0.5 mg via INTRAVENOUS
  Filled 2018-06-10: qty 1

## 2018-06-10 MED ORDER — CELECOXIB 200 MG PO CAPS
200.0000 mg | ORAL_CAPSULE | Freq: Two times a day (BID) | ORAL | Status: DC
  Administered 2018-06-10 – 2018-06-11 (×2): 200 mg via ORAL
  Filled 2018-06-10 (×2): qty 1

## 2018-06-10 MED ORDER — FENTANYL CITRATE (PF) 100 MCG/2ML IJ SOLN
INTRAMUSCULAR | Status: AC
  Administered 2018-06-10: 100 ug via INTRAVENOUS
  Filled 2018-06-10: qty 2

## 2018-06-10 MED ORDER — DOCUSATE SODIUM 100 MG PO CAPS
100.0000 mg | ORAL_CAPSULE | Freq: Two times a day (BID) | ORAL | Status: DC
  Administered 2018-06-10 – 2018-06-11 (×2): 100 mg via ORAL
  Filled 2018-06-10 (×2): qty 1

## 2018-06-10 MED ORDER — METOCLOPRAMIDE HCL 5 MG PO TABS
5.0000 mg | ORAL_TABLET | Freq: Three times a day (TID) | ORAL | Status: DC | PRN
Start: 2018-06-10 — End: 2018-06-11

## 2018-06-10 MED ORDER — PHENOL 1.4 % MT LIQD
1.0000 | OROMUCOSAL | Status: DC | PRN
  Filled 2018-06-10: qty 177

## 2018-06-10 MED ORDER — DEXAMETHASONE SODIUM PHOSPHATE 10 MG/ML IJ SOLN
INTRAMUSCULAR | Status: AC
  Filled 2018-06-10: qty 1

## 2018-06-10 MED ORDER — KETOROLAC TROMETHAMINE 30 MG/ML IJ SOLN
INTRAMUSCULAR | Status: AC
  Filled 2018-06-10: qty 1

## 2018-06-10 MED ORDER — METOCLOPRAMIDE HCL 5 MG/ML IJ SOLN
5.0000 mg | Freq: Three times a day (TID) | INTRAMUSCULAR | Status: DC | PRN
Start: 2018-06-10 — End: 2018-06-11
  Administered 2018-06-11: 10 mg via INTRAVENOUS
  Filled 2018-06-10: qty 2

## 2018-06-10 MED ORDER — MIDAZOLAM HCL 2 MG/2ML IJ SOLN
INTRAMUSCULAR | Status: AC
  Administered 2018-06-10: 2 mg via INTRAVENOUS
  Filled 2018-06-10: qty 2

## 2018-06-10 MED ORDER — STERILE WATER FOR IRRIGATION IR SOLN
Status: DC | PRN
  Administered 2018-06-10: 2000 mL

## 2018-06-10 MED ORDER — BUPIVACAINE-EPINEPHRINE (PF) 0.25% -1:200000 IJ SOLN
INTRAMUSCULAR | Status: DC | PRN
  Administered 2018-06-10: 30 mL

## 2018-06-10 MED ORDER — HYDROCODONE-ACETAMINOPHEN 7.5-325 MG PO TABS
1.0000 | ORAL_TABLET | ORAL | Status: DC | PRN
  Administered 2018-06-11 (×5): 2 via ORAL
  Filled 2018-06-10 (×5): qty 2

## 2018-06-10 MED ORDER — HYDROCODONE-ACETAMINOPHEN 5-325 MG PO TABS
1.0000 | ORAL_TABLET | ORAL | Status: DC | PRN
  Administered 2018-06-10: 2 via ORAL
  Administered 2018-06-10: 1 via ORAL
  Filled 2018-06-10: qty 2
  Filled 2018-06-10: qty 1

## 2018-06-10 MED ORDER — EPHEDRINE 5 MG/ML INJ
INTRAVENOUS | Status: AC
  Filled 2018-06-10: qty 10

## 2018-06-10 MED ORDER — PROMETHAZINE HCL 25 MG/ML IJ SOLN
6.2500 mg | INTRAMUSCULAR | Status: DC | PRN
Start: 2018-06-10 — End: 2018-06-10

## 2018-06-10 MED ORDER — METHOCARBAMOL 1000 MG/10ML IJ SOLN
500.0000 mg | Freq: Four times a day (QID) | INTRAVENOUS | Status: DC | PRN
  Administered 2018-06-10: 500 mg via INTRAVENOUS
  Filled 2018-06-10: qty 550

## 2018-06-10 MED ORDER — ONDANSETRON HCL 4 MG/2ML IJ SOLN
INTRAMUSCULAR | Status: AC
  Filled 2018-06-10: qty 2

## 2018-06-10 MED ORDER — FERROUS SULFATE 325 (65 FE) MG PO TABS: 325.0000 mg | ORAL_TABLET | Freq: Three times a day (TID) | ORAL | 3 refills | Status: DC

## 2018-06-10 MED ORDER — SODIUM CHLORIDE 0.9 % IJ SOLN
INTRAMUSCULAR | Status: AC
  Filled 2018-06-10: qty 50

## 2018-06-10 SURGICAL SUPPLY — 46 items
BAG ZIPLOCK 12X15 (MISCELLANEOUS) IMPLANT
BANDAGE ACE 6X5 VEL STRL LF (GAUZE/BANDAGES/DRESSINGS) ×3 IMPLANT
BLADE SAW SGTL 11.0X1.19X90.0M (BLADE) IMPLANT
BLADE SAW SGTL 13.0X1.19X90.0M (BLADE) ×3 IMPLANT
BOWL SMART MIX CTS (DISPOSABLE) ×3 IMPLANT
CAPT KNEE TOTAL 3 ATTUNE ×3 IMPLANT
CEMENT HV SMART SET (Cement) ×6 IMPLANT
COVER SURGICAL LIGHT HANDLE (MISCELLANEOUS) ×3 IMPLANT
CUFF TOURN SGL QUICK 34 (TOURNIQUET CUFF) ×3
CUFF TRNQT CYL 34X4X40X1 (TOURNIQUET CUFF) ×1 IMPLANT
DECANTER SPIKE VIAL GLASS SM (MISCELLANEOUS) ×6 IMPLANT
DERMABOND ADVANCED (GAUZE/BANDAGES/DRESSINGS) ×2
DERMABOND ADVANCED .7 DNX12 (GAUZE/BANDAGES/DRESSINGS) ×1 IMPLANT
DRAPE U-SHAPE 47X51 STRL (DRAPES) ×3 IMPLANT
DRESSING AQUACEL AG SP 3.5X10 (GAUZE/BANDAGES/DRESSINGS) ×1 IMPLANT
DRSG AQUACEL AG ADV 3.5X10 (GAUZE/BANDAGES/DRESSINGS) ×3 IMPLANT
DRSG AQUACEL AG SP 3.5X10 (GAUZE/BANDAGES/DRESSINGS) ×3
DURAPREP 26ML APPLICATOR (WOUND CARE) ×6 IMPLANT
ELECT REM PT RETURN 15FT ADLT (MISCELLANEOUS) ×3 IMPLANT
GLOVE BIOGEL M 7.0 STRL (GLOVE) IMPLANT
GLOVE BIOGEL PI IND STRL 7.5 (GLOVE) ×1 IMPLANT
GLOVE BIOGEL PI IND STRL 8.5 (GLOVE) ×1 IMPLANT
GLOVE BIOGEL PI INDICATOR 7.5 (GLOVE) ×2
GLOVE BIOGEL PI INDICATOR 8.5 (GLOVE) ×2
GLOVE ECLIPSE 8.0 STRL XLNG CF (GLOVE) ×3 IMPLANT
GLOVE ORTHO TXT STRL SZ7.5 (GLOVE) ×6 IMPLANT
GOWN STRL REUS W/TWL 2XL LVL3 (GOWN DISPOSABLE) ×3 IMPLANT
GOWN STRL REUS W/TWL LRG LVL3 (GOWN DISPOSABLE) ×3 IMPLANT
HANDPIECE INTERPULSE COAX TIP (DISPOSABLE) ×2
HOLDER FOLEY CATH W/STRAP (MISCELLANEOUS) IMPLANT
MANIFOLD NEPTUNE II (INSTRUMENTS) ×3 IMPLANT
NDL SAFETY ECLIPSE 18X1.5 (NEEDLE) IMPLANT
NEEDLE HYPO 18GX1.5 SHARP (NEEDLE)
PACK TOTAL KNEE CUSTOM (KITS) ×3 IMPLANT
POSITIONER SURGICAL ARM (MISCELLANEOUS) ×3 IMPLANT
SET HNDPC FAN SPRY TIP SCT (DISPOSABLE) ×1 IMPLANT
SET PAD KNEE POSITIONER (MISCELLANEOUS) ×3 IMPLANT
SUT MNCRL AB 4-0 PS2 18 (SUTURE) ×3 IMPLANT
SUT STRATAFIX PDS+ 0 24IN (SUTURE) ×3 IMPLANT
SUT VIC AB 1 CT1 36 (SUTURE) ×3 IMPLANT
SUT VIC AB 2-0 CT1 27 (SUTURE) ×6
SUT VIC AB 2-0 CT1 TAPERPNT 27 (SUTURE) ×3 IMPLANT
SYRINGE 3CC LL L/F (MISCELLANEOUS) ×3 IMPLANT
TRAY FOLEY MTR SLVR 16FR STAT (SET/KITS/TRAYS/PACK) ×3 IMPLANT
WRAP KNEE MAXI GEL POST OP (GAUZE/BANDAGES/DRESSINGS) ×3 IMPLANT
YANKAUER SUCT BULB TIP 10FT TU (MISCELLANEOUS) ×3 IMPLANT

## 2018-06-10 NOTE — Discharge Instructions (Signed)

## 2018-06-10 NOTE — Anesthesia Procedure Notes (Signed)
Spinal  Patient location during procedure: OR Start time: 06/10/2018 12:53 PM End time: 06/10/2018 12:59 PM Staffing Anesthesiologist: Heather Roberts, MD Resident/CRNA: Vanessa [place], CRNA Performed: resident/CRNA  Preanesthetic Checklist Completed: patient identified, site marked, surgical consent, pre-op evaluation, timeout performed, IV checked, risks and benefits discussed and monitors and equipment checked Spinal Block Patient position: sitting Prep: DuraPrep Patient monitoring: blood pressure, continuous pulse ox and heart rate Approach: midline Location: L3-4 Injection technique: single-shot Needle Needle type: Pencan  Needle gauge: 25 G Needle length: 9 cm Needle insertion depth: 8 cm Assessment Sensory level: T6

## 2018-06-10 NOTE — Transfer of Care (Signed)
Immediate Anesthesia Transfer of Care Note  Patient: Pamela Pena  Procedure(s) Performed: TOTAL KNEE ARTHROPLASTY (Right Knee)  Patient Location: PACU  Anesthesia Type:Spinal  Level of Consciousness: awake, alert , oriented and patient cooperative  Airway & Oxygen Therapy: Patient Spontanous Breathing and Patient connected to face mask  Post-op Assessment: Report given to RN and Post -op Vital signs reviewed and stable  Post vital signs: Reviewed and stable  Last Vitals:  Vitals Value Taken Time  BP 134/63 06/10/2018  2:38 PM  Temp    Pulse 79 06/10/2018  2:40 PM  Resp 13 06/10/2018  2:40 PM  SpO2 92 % 06/10/2018  2:40 PM  Vitals shown include unvalidated device data.  Last Pain:  Vitals:   06/10/18 1057  TempSrc: Oral      Patients Stated Pain Goal: 3 (06/10/18 1119)  Complications: No apparent anesthesia complications

## 2018-06-10 NOTE — Progress Notes (Signed)
Assisted Dr. Singer with right, ultrasound guided, adductor canal block. Side rails up, monitors on throughout procedure. See vital signs in flow sheet. Tolerated Procedure well.  

## 2018-06-10 NOTE — Plan of Care (Signed)
  Problem: Clinical Measurements: Goal: Ability to maintain clinical measurements within normal limits will improve Outcome: Progressing   Problem: Clinical Measurements: Goal: Respiratory complications will improve Outcome: Progressing   Problem: Clinical Measurements: Goal: Cardiovascular complication will be avoided Outcome: Progressing   Problem: Activity: Goal: Risk for activity intolerance will decrease Outcome: Progressing   

## 2018-06-10 NOTE — Anesthesia Procedure Notes (Signed)
Anesthesia Regional Block: Adductor canal block   Pre-Anesthetic Checklist: ,, timeout performed, Correct Patient, Correct Site, Correct Laterality, Correct Procedure, Correct Position, site marked, Risks and benefits discussed,  Surgical consent,  Pre-op evaluation,  At surgeon's request and post-op pain management  Laterality: Right  Prep: chloraprep       Needles:  Injection technique: Single-shot  Needle Type: Stimulator Needle - 80     Needle Length: 10cm  Needle Gauge: 21     Additional Needles:   Narrative:  Start time: 06/10/2018 12:08 PM End time: 06/10/2018 12:18 PM Injection made incrementally with aspirations every 5 mL.  Performed by: Personally

## 2018-06-10 NOTE — Interval H&P Note (Signed)
History and Physical Interval Note:  06/10/2018 12:20 PM  [name]  has presented today for surgery, with the diagnosis of Right knee osteoarthritis  The various methods of treatment have been discussed with the patient and family. After consideration of risks, benefits and other options for treatment, the patient has consented to  Procedure(s) with comments: TOTAL KNEE ARTHROPLASTY (Right) - 90 mins as a surgical intervention .  The patient's history has been reviewed, patient examined, no change in status, stable for surgery.  I have reviewed the patient's chart and labs.  Questions were answered to the patient's satisfaction.     Shelda Pal

## 2018-06-10 NOTE — Anesthesia Preprocedure Evaluation (Signed)
Anesthesia Evaluation  Patient identified by MRN, date of birth, ID band Patient awake    Reviewed: Allergy & Precautions, NPO status , Patient's Chart, lab work & pertinent test results  History of Anesthesia Complications Negative for: history of anesthetic complications  Airway Mallampati: II  TM Distance: >3 FB Neck ROM: Full    Dental no notable dental hx. (+) Dental Advisory Given   Pulmonary neg pulmonary ROS, former smoker,    Pulmonary exam normal        Cardiovascular negative cardio ROS Normal cardiovascular exam     Neuro/Psych negative neurological ROS  negative psych ROS   GI/Hepatic negative GI ROS, Neg liver ROS,   Endo/Other  negative endocrine ROS  Renal/GU negative Renal ROS  negative genitourinary   Musculoskeletal  (+) Arthritis , Rheumatoid disorders,    Abdominal   Peds negative pediatric ROS (+)  Hematology negative hematology ROS (+)   Anesthesia Other Findings   Reproductive/Obstetrics negative OB ROS                             Anesthesia Physical Anesthesia Plan  ASA: II  Anesthesia Plan: Spinal and MAC   Post-op Pain Management:    Induction:   PONV Risk Score and Plan: 2 and Ondansetron and Propofol infusion  Airway Management Planned: Natural Airway  Additional Equipment:   Intra-op Plan:   Post-operative Plan:   Informed Consent: I have reviewed the patients History and Physical, chart, labs and discussed the procedure including the risks, benefits and alternatives for the proposed anesthesia with the patient or authorized representative who has indicated his/her understanding and acceptance.   Dental advisory given  Plan Discussed with: CRNA and Anesthesiologist  Anesthesia Plan Comments:         Anesthesia Quick Evaluation

## 2018-06-10 NOTE — Anesthesia Procedure Notes (Signed)
Date/Time: 06/10/2018 1:04 PM Performed by: Vanessa [place], CRNA Oxygen Delivery Method: Simple face mask

## 2018-06-10 NOTE — Op Note (Signed)
NAME:  Pamela Pena                      MEDICAL RECORD NO.:  [id number]                             FACILITY:  [facility]      PHYSICIAN:  Madlyn Frankel. Charlann Boxer, M.D.  DATE OF BIRTH:  November 02, 1951      DATE OF PROCEDURE:  06/10/2018                                     OPERATIVE REPORT         PREOPERATIVE DIAGNOSIS:  Right knee osteoarthritis.      POSTOPERATIVE DIAGNOSIS:  Right knee osteoarthritis.      FINDINGS:  The patient was noted to have complete loss of cartilage and   bone-on-bone arthritis with associated osteophytes in the medial and patellofemoral compartments of   the knee with a significant synovitis and associated effusion.  The patient had failed months of conservative treatment including medications, injection therapy, activity modification.     PROCEDURE:  Right total knee replacement.      COMPONENTS USED:  DePuy Attune rotating platform posterior stabilized knee   system, a size 4N femur, 3 tibia, size 8 mm PS AOX insert, and 35 anatomic patellar   button.      SURGEON:  Madlyn Frankel. Charlann Boxer, M.D.      ASSISTANT:  Lanney Gins, PA-C.      ANESTHESIA:  Regional and Spinal.      SPECIMENS:  None.      COMPLICATION:  None.      DRAINS:  None.  EBL: <50cc      TOURNIQUET TIME:   Total Tourniquet Time Documented: Thigh (Right) - 27 minutes Total: Thigh (Right) - 27 minutes  .      The patient was stable to the recovery room.      INDICATION FOR PROCEDURE:  Pamela Pena is a 67 y.o. female patient of   mine.  The patient had been seen, evaluated, and treated for months conservatively in the   office with medication, activity modification, and injections.  The patient had   radiographic changes of bone-on-bone arthritis with endplate sclerosis and osteophytes noted.  Based on the radiographic changes and failed conservative measures, the patient   decided to proceed with definitive treatment, total knee replacement.  Risks of infection, DVT, component failure, need  for revision surgery, neurovascular injury were reviewed in the office setting.  The postop course was reviewed stressing the efforts to maximize post-operative satisfaction and function.  Consent was obtained for benefit of pain   relief.      PROCEDURE IN DETAIL:  The patient was brought to the operative theater.   Once adequate anesthesia, preoperative antibiotics, 2 gm of Ancef,1 gm of Tranexamic Acid, and 10 mg of Decadron administered, the patient was positioned supine with a right thigh tourniquet placed.  The  right lower extremity was prepped and draped in sterile fashion.  A time-   out was performed identifying the patient, planned procedure, and the appropriate extremity.      The right lower extremity was placed in the [facility] leg holder.  The leg was   exsanguinated, tourniquet elevated to 250 mmHg.  A midline incision was   made  followed by median parapatellar arthrotomy.  Following initial   exposure, attention was first directed to the patella.  Precut   measurement was noted to be 21 mm.  I resected down to 13 mm and used a   35 anatomic patellar button to restore patellar height as well as cover the cut surface.      The lug holes were drilled and a metal shim was placed to protect the   patella from retractors and saw blade during the procedure.      At this point, attention was now directed to the femur.  The femoral   canal was opened with a drill, irrigated to try to prevent fat emboli.  An   intramedullary rod was passed at 3 degrees valgus, 9 mm of bone was   resected off the distal femur.  Following this resection, the tibia was   subluxated anteriorly.  Using the extramedullary guide, 2 mm of bone was resected off   the proximal medial tibia.  We confirmed the gap would be   stable medially and laterally with a size 6 spacer block as well as confirmed that the tibial cut was perpendicular in the coronal plane, checking with an alignment rod.      Once this was  done, I sized the femur to be a size 4 in the anterior-   posterior dimension, chose a narrow component based on medial and   lateral dimension.  The size 4 rotation block was then pinned in   position anterior referenced using the C-clamp to set rotation.  The   anterior, posterior, and  chamfer cuts were made without difficulty nor   notching making certain that I was along the anterior cortex to help   with flexion gap stability.      The final box cut was made off the lateral aspect of distal femur.      At this point, the tibia was sized to be a size 3.  The size 3 tray was   then pinned in position through the medial third of the tubercle,   drilled, and keel punched.  Trial reduction was now carried with a 4 femur,  3 tibia, up to the size 8 mm PS insert, and the 35 anatomic patella botton.  The knee was brought to full extension with good flexion stability with the patella   tracking through the trochlea without application of pressure.  Given   all these findings the trial components removed.  Final components were   opened and cement was mixed.  The knee was irrigated with normal saline solution and pulse lavage.  The synovial lining was   then injected with 30 cc of 0.25% Marcaine with epinephrine, 1 cc of Toradol and 30 cc of NS for a total of 61 cc.     Final implants were then cemented onto cleaned and dried cut surfaces of bone with the knee brought to extension with a size 8 mm PS trial insert.      Once the cement had fully cured, excess cement was removed   throughout the knee.  I confirmed that I was satisfied with the range of   motion and stability, and the final size 8 mm PS AOX insert was chosen.  It was   placed into the knee.      The tourniquet had been let down at 27 minutes.  No significant   hemostasis was required.  The extensor mechanism was then reapproximated using #1  Vicryl and #1 Stratafix sutures with the knee   in flexion.  The   remaining wound was  closed with 2-0 Vicryl and running 4-0 Monocryl.   The knee was cleaned, dried, dressed sterilely using Dermabond and   Aquacel dressing.  The patient was then   brought to recovery room in stable condition, tolerating the procedure   well.   Please note that Physician Assistant, Lanney Gins, PA-C was present for the entirety of the case, and was utilized for pre-operative positioning, peri-operative retractor management, general facilitation of the procedure and for primary wound closure at the end of the case.              Madlyn Frankel Charlann Boxer, M.D.    06/10/2018 2:14 PM

## 2018-06-10 NOTE — Anesthesia Postprocedure Evaluation (Signed)
Anesthesia Post Note  Patient: Pamela Pena  Procedure(s) Performed: TOTAL KNEE ARTHROPLASTY (Right Knee)     Patient location during evaluation: PACU Anesthesia Type: MAC and Spinal Level of consciousness: awake and alert Pain management: pain level controlled Vital Signs Assessment: post-procedure vital signs reviewed and stable Respiratory status: spontaneous breathing and respiratory function stable Cardiovascular status: blood pressure returned to baseline and stable Postop Assessment: spinal receding Anesthetic complications: no    Last Vitals:  Vitals:   06/10/18 1500 06/10/18 1515  BP: 130/73 116/80  Pulse: 64 70  Resp: 17 14  Temp:    SpO2: 98% 98%    Last Pain:  Vitals:   06/10/18 1515  TempSrc:   PainSc: 0-No pain    LLE Motor Response: Purposeful movement (06/10/18 1515) LLE Sensation: Decreased;Numbness;Tingling(feet) (06/10/18 1515) RLE Motor Response: Purposeful movement (06/10/18 1515) RLE Sensation: Numbness;Decreased;Tingling(feet) (06/10/18 1515) L Sensory Level: S1-Sole of foot, small toes (06/10/18 1515) R Sensory Level: S1-Sole of foot, small toes (06/10/18 1515)  Sherard Sutch DANIEL

## 2018-06-11 DIAGNOSIS — M1711 Unilateral primary osteoarthritis, right knee: Secondary | ICD-10-CM | POA: Diagnosis not present

## 2018-06-11 LAB — BASIC METABOLIC PANEL
Anion gap: 5 (ref 5–15)
BUN: 11 mg/dL (ref 8–23)
CHLORIDE: 106 mmol/L (ref 98–111)
CO2: 28 mmol/L (ref 22–32)
CREATININE: 0.62 mg/dL (ref 0.44–1.00)
Calcium: 8.7 mg/dL — ABNORMAL LOW (ref 8.9–10.3)
GFR calc non Af Amer: >60 mL/min (ref >60)
Glucose, Bld: 135 mg/dL — ABNORMAL HIGH (ref 70–99)
POTASSIUM: 4.5 mmol/L (ref 3.5–5.1)
Sodium: 139 mmol/L (ref 135–145)

## 2018-06-11 LAB — CBC
HEMATOCRIT: 35.8 % — AB (ref 36.0–46.0)
HEMOGLOBIN: 11.9 g/dL — AB (ref 12.0–15.0)
MCH: 34.4 pg — AB (ref 26.0–34.0)
MCHC: 33.2 g/dL (ref 30.0–36.0)
MCV: 103.5 fL — ABNORMAL HIGH (ref 78.0–100.0)
Platelets: 218 10*3/uL (ref 150–400)
RBC: 3.46 MIL/uL — ABNORMAL LOW (ref 3.87–5.11)
RDW: 15.4 % (ref 11.5–15.5)
WBC: 14.3 10*3/uL — ABNORMAL HIGH (ref 4.0–10.5)

## 2018-06-11 MED ORDER — PROMETHAZINE HCL 12.5 MG PO TABS: 12.5000 mg | ORAL_TABLET | Freq: Four times a day (QID) | ORAL | 0 refills | Status: DC | PRN

## 2018-06-11 MED ORDER — PROMETHAZINE HCL 25 MG/ML IJ SOLN
6.2500 mg | Freq: Four times a day (QID) | INTRAMUSCULAR | Status: DC | PRN
  Administered 2018-06-11: 12.5 mg via INTRAVENOUS
  Filled 2018-06-11: qty 1

## 2018-06-11 MED ORDER — PROMETHAZINE HCL 25 MG PO TABS: 12.5000 mg | ORAL_TABLET | Freq: Four times a day (QID) | ORAL | Status: DC | PRN

## 2018-06-11 MED ORDER — CELECOXIB 200 MG PO CAPS: 200.0000 mg | ORAL_CAPSULE | Freq: Two times a day (BID) | ORAL | 0 refills | Status: DC

## 2018-06-11 NOTE — Progress Notes (Signed)
Patient ID: Pamela Pena, female   DOB: April 29, 1951, 67 y.o.   MRN: [id number] Subjective: 1 Day Post-Op Procedure(s) (LRB): TOTAL KNEE ARTHROPLASTY (Right)    Patient reports pain as moderate but comfortable this am. Her daughter is in room with her from night  Objective:   VITALS:   Vitals:   06/11/18 0212 06/11/18 0600  BP: 105/61 (!) 96/51  Pulse: 61 62  Resp: 16 18  Temp: 97.7 F (36.5 C) (!) 97.5 F (36.4 C)  SpO2: 98% 99%    Neurovascular intact Incision: dressing C/D/I  LABS Recent Labs    06/11/18 0603  HGB 11.9*  HCT 35.8*  WBC 14.3*  PLT 218    Recent Labs    06/11/18 0603  NA 139  K 4.5  BUN 11  CREATININE 0.62  GLUCOSE 135*    No results for input(s): LABPT, INR in the last 72 hours.   Assessment/Plan: 1 Day Post-Op Procedure(s) (LRB): TOTAL KNEE ARTHROPLASTY (Right)   Advance diet Up with therapy   RTC in 2 weeks Reviewed questions and goals Celebrex at discharge

## 2018-06-11 NOTE — Evaluation (Signed)
Physical Therapy Evaluation Patient Details Name: Pamela Pena MRN: [id number] DOB: Sep 02, 1951 Today's Date: 06/11/2018   History of Present Illness  RTKA  Clinical Impression  The patient had emesis x 1 after ambulating. Patient is progressing well. Plans DC today after PT. Pt admitted with above diagnosis. Pt currently with functional limitations due to the deficits listed below (see PT Problem List).Pt will benefit from skilled PT to increase their independence and safety with mobility to allow discharge to the venue listed below.      Follow Up Recommendations Follow surgeon's recommendation for DC plan and follow-up therapies;Home health PT    Equipment Recommendations  None recommended by PT    Recommendations for Other Services       Precautions / Restrictions Precautions Precautions: Knee;Fall Restrictions RLE Weight Bearing: Weight bearing as tolerated      Mobility  Bed Mobility Overal bed mobility: Needs Assistance Bed Mobility: Supine to Sit     Supine to sit: Min guard     General bed mobility comments: manages right leg  Transfers Overall transfer level: Needs assistance Equipment used: Rolling walker (2 wheeled) Transfers: Sit to/from Stand Sit to Stand: Min assist         General transfer comment: cues for hand and right leg position  Ambulation/Gait Ambulation/Gait assistance: Min assist Gait Distance (Feet): 90 Feet Assistive device: Rolling walker (2 wheeled) Gait Pattern/deviations: Step-to pattern;Step-through pattern     General Gait Details: cues for sequence  Stairs            Wheelchair Mobility    Modified Rankin (Stroke Patients Only)       Balance                                             Pertinent Vitals/Pain Pain Assessment: 0-10 Pain Score: 4  Pain Location: right knee Pain Descriptors / Indicators: Discomfort;Sore Pain Intervention(s): Premedicated before session;Monitored during  session;Ice applied    Home Living Family/patient expects to be discharged to:: Private residence Living Arrangements: Children;Parent Available Help at Discharge: Family Type of Home: House Home Access: Level entry     Home Layout: One level Home Equipment: Environmental consultant - 2 wheels      Prior Function Level of Independence: Independent               Hand Dominance        Extremity/Trunk Assessment   Upper Extremity Assessment Upper Extremity Assessment: Overall WFL for tasks assessed    Lower Extremity Assessment Lower Extremity Assessment: RLE deficits/detail RLE Deficits / Details: knee flex5-90, + SLR    Cervical / Trunk Assessment Cervical / Trunk Assessment: Normal  Communication   Communication: No difficulties  Cognition Arousal/Alertness: Awake/alert Behavior During Therapy: WFL for tasks assessed/performed Overall Cognitive Status: Within Functional Limits for tasks assessed                                        General Comments      Exercises Total Joint Exercises Ankle Circles/Pumps: AROM;Both;10 reps Quad Sets: AROM;Both;10 reps Hip ABduction/ADduction: AROM;Right;10 reps Straight Leg Raises: AAROM;Right;10 reps Long Arc Quad: AROM;Right;10 reps Knee Flexion: AROM;Right;10 reps   Assessment/Plan    PT Assessment Patient needs continued PT services  PT Problem List Decreased strength;Decreased range  of motion;Decreased activity tolerance;Decreased mobility;Decreased knowledge of precautions;Decreased safety awareness;Decreased knowledge of use of DME;Pain       PT Treatment Interventions DME instruction;Gait training;Functional mobility training;Therapeutic activities;Patient/family education    PT Goals (Current goals can be found in the Care Plan section)  Acute Rehab PT Goals Patient Stated Goal: go home PT Goal Formulation: With patient/family Time For Goal Achievement: 06/13/18 Potential to Achieve Goals: Good     Frequency 7X/week   Barriers to discharge        Co-evaluation               AM-PAC PT "6 Clicks" Daily Activity  Outcome Measure Difficulty turning over in bed (including adjusting bedclothes, sheets and blankets)?: A Little Difficulty moving from lying on back to sitting on the side of the bed? : A Little Difficulty sitting down on and standing up from a chair with arms (e.g., wheelchair, bedside commode, etc,.)?: A Little Help needed moving to and from a bed to chair (including a wheelchair)?: A Little Help needed walking in hospital room?: A Little Help needed climbing 3-5 steps with a railing? : A Lot 6 Click Score: 17    End of Session   Activity Tolerance: Patient tolerated treatment well Patient left: in chair;with call bell/phone within reach;with family/visitor present Nurse Communication: Mobility status PT Visit Diagnosis: Unsteadiness on feet (R26.81);Pain Pain - Right/Left: Right Pain - part of body: Knee    Time: 3546-5681 PT Time Calculation (min) (ACUTE ONLY): 31 min   Charges:   PT Evaluation $PT Eval Low Complexity: 1 Low PT Treatments $Gait Training: 8-22 mins   PT G CodesBlanchard Kelch PT [phone]   [name] 06/11/2018, 2:19 PM

## 2018-06-11 NOTE — Progress Notes (Signed)
Physical Therapy Treatment Patient Details Name: Pamela Pena MRN: [id number] DOB: 04-Apr-1951 Today's Date: 06/11/2018    History of Present Illness RTKA    PT Comments    Patient is progressing well. revirewed HEP> Hoping to Dc today.   Follow Up Recommendations  Follow surgeon's recommendation for DC plan and follow-up therapies;Home health PT     Equipment Recommendations  None recommended by PT    Recommendations for Other Services       Precautions / Restrictions Precautions Precautions: Knee;Fall Restrictions RLE Weight Bearing: Weight bearing as tolerated    Mobility  Bed Mobility Overal bed mobility: Needs Assistance Bed Mobility: Sit to Supine     Supine to sit: Min guard Sit to supine: Supervision   General bed mobility comments: manages right leg  Transfers Overall transfer level: Needs assistance Equipment used: Rolling walker (2 wheeled) Transfers: Sit to/from Stand Sit to Stand: Supervision         General transfer comment: cues for hand and right leg position  Ambulation/Gait Ambulation/Gait assistance: Min guard Gait Distance (Feet): 150 Feet Assistive device: Rolling walker (2 wheeled) Gait Pattern/deviations: Step-to pattern;Step-through pattern     General Gait Details: cues for sequence,   Stairs             Wheelchair Mobility    Modified Rankin (Stroke Patients Only)       Balance                                            Cognition Arousal/Alertness: Awake/alert Behavior During Therapy: WFL for tasks assessed/performed Overall Cognitive Status: Within Functional Limits for tasks assessed                                        Exercises Total Joint Exercises Ankle Circles/Pumps: AROM;Both;10 reps Quad Sets: AROM;Both;10 reps Hip ABduction/ADduction: AROM;Right;10 reps Straight Leg Raises: AAROM;Right;10 reps Long Arc Quad: AROM;Right;10 reps Knee Flexion: AROM;Right;10  reps    General Comments        Pertinent Vitals/Pain Pain Assessment: 0-10 Pain Score: 3  Pain Location: right knee Pain Descriptors / Indicators: Discomfort;Sore Pain Intervention(s): Monitored during session;Premedicated before session;Ice applied    Home Living Family/patient expects to be discharged to:: Private residence Living Arrangements: Children;Parent Available Help at Discharge: Family Type of Home: House Home Access: Level entry   Home Layout: One level Home Equipment: Environmental consultant - 2 wheels      Prior Function Level of Independence: Independent          PT Goals (current goals can now be found in the care plan section) Acute Rehab PT Goals Patient Stated Goal: go home PT Goal Formulation: With patient/family Time For Goal Achievement: 06/13/18 Potential to Achieve Goals: Good Progress towards PT goals: Progressing toward goals    Frequency    7X/week      PT Plan Current plan remains appropriate    Co-evaluation              AM-PAC PT "6 Clicks" Daily Activity  Outcome Measure  Difficulty turning over in bed (including adjusting bedclothes, sheets and blankets)?: A Little Difficulty moving from lying on back to sitting on the side of the bed? : A Little Difficulty sitting down on and standing up from a chair  with arms (e.g., wheelchair, bedside commode, etc,.)?: A Little Help needed moving to and from a bed to chair (including a wheelchair)?: A Little Help needed walking in hospital room?: A Little Help needed climbing 3-5 steps with a railing? : A Lot 6 Click Score: 14    End of Session   Activity Tolerance: Patient tolerated treatment well Patient left: in bed;with call bell/phone within reach Nurse Communication: Mobility status PT Visit Diagnosis: Unsteadiness on feet (R26.81);Pain Pain - Right/Left: Right Pain - part of body: Knee     Time: 1420-1447 PT Time Calculation (min) (ACUTE ONLY): 27 min  Charges:  $Gait Training:  8-22 mins $Therapeutic Exercise: 8-22 mins                    G Codes:           [name] 06/11/2018, 5:03 PM

## 2018-06-11 NOTE — Progress Notes (Signed)
Discharge planning, spoke with patient and spouse at beside. Chose [facility] for HH services, PT to eval and treat. Contacted [facility] for referral. Has RW and 3-n-1. [phone] 

## 2018-06-12 ENCOUNTER — Encounter (HOSPITAL_COMMUNITY): Payer: Self-pay | Admitting: Orthopedic Surgery

## 2018-06-17 NOTE — Discharge Summary (Signed)
Physician Discharge Summary  Patient ID: Pamela Pena MRN: [id number] DOB/AGE: June 26, 1951 67 y.o.  Admit date: 06/10/2018 Discharge date: 06/11/2018   Procedures:  Procedure(s) (LRB): TOTAL KNEE ARTHROPLASTY (Right)  Attending Physician:  Dr. Durene Romans   Admission Diagnoses:   Right knee primary OA / pain  Discharge Diagnoses:  Principal Problem:   S/P right TKA Active Problems:   S/P total knee replacement  Past Medical History:  Diagnosis Date  . Disc degeneration, lumbar   . Piriformis syndrome   . RA (rheumatoid arthritis) (HCC)     HPI:    Pamela Pena, 67 y.o. female, has a history of pain and functional disability in the right knee due to arthritis and has failed non-surgical conservative treatments for greater than 12 weeks to include NSAID's and/or analgesics, corticosteriod injections and activity modification. Onset of symptoms was gradual, starting 3-4 years ago with gradually worsening course since that time. The patient noted no past surgery on the right knee(s). Patient currently rates pain in the right knee(s) at 8 out of 10 with activity. Patient has night pain, worsening of pain with activity and weight bearing, pain that interferes with activities of daily living, pain with passive range of motion, crepitus and joint swelling. Patient has evidence of periarticular osteophytes and joint space narrowing by imaging studies. There is no active infection. Risks, benefits and expectations were discussed with the patient. Risks including but not limited to the risk of anesthesia, blood clots, nerve damage, blood vessel damage, failure of the prosthesis, infection and up to and including death. Patient understand the risks, benefits and expectations and wishes to proceed with surgery.   PCP: Merri Brunette, MD   Discharged Condition: good  Hospital Course:  Patient underwent the above stated procedure on 06/10/2018. Patient tolerated the procedure well and brought to  the recovery room in good condition and subsequently to the floor.  POD #1 BP: 96/51 ; Pulse: 62 ; Temp: 97.5 F (36.4 C) ; Resp: 18 Patient reports pain as moderate but comfortable this am. Her daughter is in room with her from night. Neurovascular intact and incision: dressing C/D/I.   LABS  Basename    HGB     11.9  HCT     35.8    Discharge Exam: General appearance: alert, cooperative and no distress Extremities: Homans sign is negative, no sign of DVT, no edema, redness or tenderness in the calves or thighs and no ulcers, gangrene or trophic changes  Disposition:  Home with follow up in 2 weeks   Follow-up Information    Durene Romans, MD. Schedule an appointment as soon as possible for a visit in 2 weeks.   Specialty:  Orthopedic Surgery Contact information: [address] [place] [address] [phone]        [facility] Follow up.   Specialty:  Home Health Services Why:  physical therapy Contact information: [address] [place] [address] [phone]           Discharge Instructions    Call MD / Call 911   Complete by:  As directed    If you experience chest pain or shortness of breath, CALL 911 and be transported to the hospital emergency room.  If you develope a fever above 101 F, pus (white drainage) or increased drainage or redness at the wound, or calf pain, call your surgeon's office.   Change dressing   Complete by:  As directed  Maintain surgical dressing until follow up in the clinic. If the edges start to pull up, may reinforce with tape. If the dressing is no longer working, may remove and cover with gauze and tape, but must keep the area dry and clean.  Call with any questions or concerns.   Constipation Prevention   Complete by:  As directed    Drink plenty of fluids.  Prune juice may be helpful.  You may use a stool softener, such as Colace (over the counter) 100 mg twice a day.  Use  MiraLax (over the counter) for constipation as needed.   Diet - low sodium heart healthy   Complete by:  As directed    Discharge instructions   Complete by:  As directed    Maintain surgical dressing until follow up in the clinic. If the edges start to pull up, may reinforce with tape. If the dressing is no longer working, may remove and cover with gauze and tape, but must keep the area dry and clean.  Follow up in 2 weeks at [facility]. Call with any questions or concerns.   Increase activity slowly as tolerated   Complete by:  As directed    Weight bearing as tolerated with assist device (walker, cane, etc) as directed, use it as long as suggested by your surgeon or therapist, typically at least 4-6 weeks.   TED hose   Complete by:  As directed    Use stockings (TED hose) for 2 weeks on both leg(s).  You may remove them at night for sleeping.      Allergies as of 06/11/2018   No Known Allergies     Medication List    STOP taking these medications   acetaminophen 650 MG CR tablet Commonly known as:  TYLENOL   methotrexate 2.5 MG tablet Commonly known as:  RHEUMATREX   naproxen 500 MG tablet Commonly known as:  NAPROSYN     TAKE these medications   aspirin 81 MG chewable tablet Commonly known as:  ASPIRIN CHILDRENS Chew 1 tablet (81 mg total) by mouth 2 (two) times daily. Take for 4 weeks, then resume regular dose.   BIOTIN 5000 5 MG Caps Generic drug:  Biotin Take 5 mg by mouth daily.   CALCIUM 600 PO Take 600 mg by mouth 2 (two) times daily.   celecoxib 200 MG capsule Commonly known as:  CELEBREX Take 1 capsule (200 mg total) by mouth 2 (two) times daily.   cyanocobalamin 1000 MCG tablet Take 1,000 mcg by mouth 2 (two) times daily.   docusate sodium 100 MG capsule Commonly known as:  COLACE Take 1 capsule (100 mg total) by mouth 2 (two) times daily.   ferrous sulfate 325 (65 FE) MG tablet Commonly known as:  FERROUSUL Take 1 tablet (325 mg  total) by mouth 3 (three) times daily with meals.   Fish Oil 1000 MG Caps Take 1,000 mg by mouth 2 (two) times daily.   folic acid 400 MCG tablet Commonly known as:  FOLVITE Take 400 mcg by mouth daily.   HYDROcodone-acetaminophen 7.5-325 MG tablet Commonly known as:  NORCO Take 1-2 tablets by mouth every 4 (four) hours as needed for moderate pain.   Magnesium 500 MG Caps Take 500 mg by mouth daily.   Melatonin 3 MG Caps Take 3 mg by mouth at bedtime.   methocarbamol 500 MG tablet Commonly known as:  ROBAXIN Take 1 tablet (500 mg total) by mouth every 6 (six) hours as  needed for muscle spasms.   OVER THE COUNTER MEDICATION Take 1 capsule by mouth daily. Herbavision (Lutein, Zeaxanthin, Bilberry Extract) Notes to patient:  Take as prescribed   polyethylene glycol packet Commonly known as:  MIRALAX / GLYCOLAX Take 17 g by mouth 2 (two) times daily.   predniSONE 5 MG tablet Commonly known as:  DELTASONE Take 5 mg by mouth daily.   promethazine 12.5 MG tablet Commonly known as:  PHENERGAN Take 1 tablet (12.5 mg total) by mouth every 6 (six) hours as needed for nausea or vomiting.   TURMERIC PO Take 1,800 mg by mouth 2 (two) times daily.   VITAMIN C PO Take 1 tablet by mouth 2 (two) times daily.   Vitamin D3 5000 units Caps Take 5,000 Units by mouth daily.   vitamin E 400 UNIT capsule Take 400 Units by mouth daily.   zolpidem 5 MG tablet Commonly known as:  AMBIEN Take 5 mg by mouth at bedtime.            Discharge Care Instructions  (From admission, onward)        Start     Ordered   06/11/18 0000  Change dressing    Comments:  Maintain surgical dressing until follow up in the clinic. If the edges start to pull up, may reinforce with tape. If the dressing is no longer working, may remove and cover with gauze and tape, but must keep the area dry and clean.  Call with any questions or concerns.   06/11/18 3546       Signed: Anastasio Auerbach. Minola Guin    PA-C  06/17/2018, 12:18 PM

## 2018-07-02 NOTE — Progress Notes (Signed)
Please place orders in epic pt. Has a preop appt. 07-10-18 Thank You!

## 2018-07-04 NOTE — H&P (Signed)
TOTAL KNEE ADMISSION H&P  Patient is being admitted for left total knee arthroplasty.  Subjective:   Chief Complaint:  Left knee primary OA / pain  HPI: Pamela Pena, 67 y.o. female, has a history of pain and functional disability in the left knee due to arthritis and has failed non-surgical conservative treatments for greater than 12 weeks to include NSAID's and/or analgesics, corticosteriod injections and activity modification.  Onset of symptoms was gradual, starting 3 years ago with gradually worsening course since that time. The patient noted prior procedures on the knee to include  arthroplasty on the right knee(s).  Patient currently rates pain in the left knee(s) at 7 out of 10 with activity. Patient has night pain, worsening of pain with activity and weight bearing, pain that interferes with activities of daily living, pain with passive range of motion, crepitus and joint swelling.  Patient has evidence of periarticular osteophytes and joint space narrowing by imaging studies.  There is no active infection.  Risks, benefits and expectations were discussed with the patient.  Risks including but not limited to the risk of anesthesia, blood clots, nerve damage, blood vessel damage, failure of the prosthesis, infection and up to and including death.  Patient understand the risks, benefits and expectations and wishes to proceed with surgery.   PCP: Merri Brunette, MD  D/C Plans:       Home  Post-op Meds:       Rx given for ASA, Zofran, Norco, Iron, Colace and MiraLax  Tranexamic Acid:      To be given - IV  Decadron:      Is to be given  FYI:     ASA  Norco  DME:   Pt already has equipment  PT:   OPPT Rx given   Patient Active Problem List   Diagnosis Date Noted  . 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 Small fiber neuropathy 02/25/2018   Past Medical History:  Diagnosis Date  . Disc degeneration, lumbar   . Piriformis syndrome   . RA (rheumatoid  arthritis) (HCC)     Past Surgical History:  Procedure Laterality Date  . CESAREAN SECTION  1970  . TONSILLECTOMY     age 45  . TOTAL KNEE ARTHROPLASTY Right 06/10/2018   Procedure: TOTAL KNEE ARTHROPLASTY;  Surgeon: Durene Romans, MD;  Location: [facility];  Service: Orthopedics;  Laterality: Right;  90 mins    No current facility-administered medications for this encounter.    Current Outpatient Medications  Medication Sig Dispense Refill Last Dose  . Ascorbic Acid (VITAMIN C) 1000 MG tablet Take 1,000 mg by mouth 2 (two) times daily.     Marland Kitchen aspirin (ASPIRIN CHILDRENS) 81 MG chewable tablet Chew 1 tablet (81 mg total) by mouth 2 (two) times daily. Take for 4 weeks, then resume regular dose. 60 tablet 0   . Biotin (BIOTIN 5000) 5 MG CAPS Take 5 mg by mouth daily.    Taking  . Calcium Carbonate (CALCIUM 600 PO) Take 600 mg by mouth 2 (two) times daily.     . celecoxib (CELEBREX) 200 MG capsule Take 1 capsule (200 mg total) by mouth 2 (two) times daily. 60 capsule 0   . Cholecalciferol (VITAMIN D3) 5000 units CAPS Take 5,000 Units by mouth daily.     . cyanocobalamin 1000 MCG tablet Take 1,000 mcg by mouth 2 (two) times daily.   Taking  . docusate sodium (COLACE) 100 MG capsule Take  1 capsule (100 mg total) by mouth 2 (two) times daily. (Patient taking differently: Take 100 mg by mouth 2 (two) times daily as needed (FOR CONSTIPATION). ) 10 capsule 0   . ferrous sulfate (FERROUSUL) 325 (65 FE) MG tablet Take 1 tablet (325 mg total) by mouth 3 (three) times daily with meals.  3   . folic acid (FOLVITE) 400 MCG tablet Take 400 mcg by mouth daily.     Marland Kitchen HYDROcodone-acetaminophen (NORCO) 7.5-325 MG tablet Take 1-2 tablets by mouth every 4 (four) hours as needed for moderate pain. 60 tablet 0   . LUTEIN-ZEAXANTHIN-BILBERRY PO Take 1 capsule by mouth daily. HERBAVISION     . Magnesium 500 MG CAPS Take 500 mg by mouth every evening.      . Melatonin 3 MG CAPS Take 3 mg by mouth at bedtime.     . Omega-3  Fatty Acids (FISH OIL) 1000 MG CAPS Take 1,000 mg by mouth 2 (two) times daily.    Taking  . polyethylene glycol (MIRALAX / GLYCOLAX) packet Take 17 g by mouth 2 (two) times daily. (Patient taking differently: Take 17 g by mouth at bedtime. ) 14 each 0   . predniSONE (DELTASONE) 5 MG tablet Take 5 mg by mouth daily.    06/10/2018 at 0700  . TURMERIC PO Take 1,800 mg by mouth 2 (two) times daily.   Taking  . vitamin E 400 UNIT capsule Take 400 Units by mouth daily.     Marland Kitchen zolpidem (AMBIEN) 5 MG tablet Take 5 mg by mouth at bedtime.    06/09/2018 at Unknown time  . methocarbamol (ROBAXIN) 500 MG tablet Take 1 tablet (500 mg total) by mouth every 6 (six) hours as needed for muscle spasms. (Patient not taking: Reported on 07/03/2018) 40 tablet 0 Not Taking at Unknown time  . promethazine (PHENERGAN) 12.5 MG tablet Take 1 tablet (12.5 mg total) by mouth every 6 (six) hours as needed for nausea or vomiting. (Patient not taking: Reported on 07/03/2018) 30 tablet 0 Not Taking at Unknown time   No Known Allergies   Social History   Tobacco Use  . Smoking status: Former Smoker    Last attempt to quit: 1974    Years since quitting: 45.6  . Smokeless tobacco: Never Used  Substance Use Topics  . Alcohol use: Not Currently    Comment: quit 1974    Family History  Problem Relation Age of Onset  . Breast cancer Maternal Aunt   . CAD Mother   . Cancer Father   . Neuropathy Neg Hx      Review of Systems  Constitutional: Negative.   HENT: Negative.   Eyes: Negative.   Respiratory: Negative.   Cardiovascular: Negative.   Gastrointestinal: Negative.   Genitourinary: Negative.   Musculoskeletal: Positive for joint pain.  Skin: Negative.   Neurological: Negative.   Endo/Heme/Allergies: Negative.   Psychiatric/Behavioral: Negative.     Objective:  Physical Exam  Constitutional: She is oriented to person, place, and time. She appears well-developed.  HENT:  Head: Normocephalic.  Eyes: Pupils are  equal, round, and reactive to light.  Neck: Neck supple. No JVD present. No tracheal deviation present. No thyromegaly present.  Cardiovascular: Normal rate, regular rhythm and intact distal pulses.  Respiratory: Effort normal and breath sounds normal. No respiratory distress. She has no wheezes.  GI: Soft. There is no tenderness. There is no guarding.  Musculoskeletal:       Left knee: She exhibits decreased range  of motion, swelling and bony tenderness. She exhibits no ecchymosis, no deformity, no laceration and no erythema. Tenderness found.  Lymphadenopathy:    She has no cervical adenopathy.  Neurological: She is alert and oriented to person, place, and time. A sensory deficit (bilateral LE neuropathy) is present.  Skin: Skin is warm and dry.  Psychiatric: She has a normal mood and affect.      Labs:  Estimated body mass index is 25.79 kg/m as calculated from the following:   Height as of 06/10/18: 5\' 5"  (1.651 m).   Weight as of 06/10/18: 70.3 kg.   Imaging Review Plain radiographs demonstrate severe degenerative joint disease of the left knee(s).  The bone quality appears to be good for age and reported activity level.   Preoperative templating of the joint replacement has been completed, documented, and submitted to the Operating Room personnel in order to optimize intra-operative equipment management.    Patient's anticipated LOS is less than 2 midnights, meeting these requirements: - Younger than 48 - Lives within 1 hour of care - Has a competent adult at home to recover with post-op recover - NO history of  - Chronic pain requiring opiods  - Diabetes  - Coronary Artery Disease  - Heart failure  - Heart attack  - Stroke  - DVT/VTE  - Cardiac arrhythmia  - Respiratory Failure/COPD  - Renal failure  - Anemia  - Advanced Liver disease        Assessment/Plan:  End stage arthritis, left knee   The patient history, physical examination, clinical judgment  of the provider and imaging studies are consistent with end stage degenerative joint disease of the left knee(s) and total knee arthroplasty is deemed medically necessary. The treatment options including medical management, injection therapy arthroscopy and arthroplasty were discussed at length. The risks and benefits of total knee arthroplasty were presented and reviewed. The risks due to aseptic loosening, infection, stiffness, patella tracking problems, thromboembolic complications and other imponderables were discussed. The patient acknowledged the explanation, agreed to proceed with the plan and consent was signed. Patient is being admitted for inpatient treatment for surgery, pain control, PT, OT, prophylactic antibiotics, VTE prophylaxis, progressive ambulation and ADL's and discharge planning. The patient is planning to be discharged home.    76 Edit Ricciardelli   PA-C  07/04/2018, 2:43 PM

## 2018-07-07 NOTE — H&P (Deleted)
TOTAL KNEE ADMISSION H&P  Patient is being admitted for left total knee arthroplasty.  Subjective:  Chief Complaint:   Left knee primary OA / pain  HPI: Pamela Pena, 67 y.o. female, has a history of pain and functional disability in the left knee due to arthritis and has failed non-surgical conservative treatments for greater than 12 weeks to include NSAID's and/or analgesics, corticosteriod injections, use of assistive devices and activity modification.  Onset of symptoms was gradual, starting 3-4 years ago with gradually worsening course since that time. The patient noted prior procedures on the knee to include  arthroplasty on the right knee(s).  Patient currently rates pain in the left knee(s) at 8 out of 10 with activity. Patient has night pain, worsening of pain with activity and weight bearing, pain that interferes with activities of daily living, pain with passive range of motion, crepitus and joint swelling.  Patient has evidence of periarticular osteophytes and joint space narrowing by imaging studies. There is no active infection.  Risks, benefits and expectations were discussed with the patient.  Risks including but not limited to the risk of anesthesia, blood clots, nerve damage, blood vessel damage, failure of the prosthesis, infection and up to and including death.  Patient understand the risks, benefits and expectations and wishes to proceed with surgery.   PCP: Merri Brunette, MD  D/C Plans:       Home   Post-op Meds:       Rx given for ASA, Robaxin, Norco, Zofran, Iron, Colace and MiraLax  Tranexamic Acid:      To be given - IV   Decadron:      Is to be given  FYI:      ASA  Norco  DME:   Pt already has equipment  PT:   OPPT Rx given   Patient Active Problem List   Diagnosis Date Noted  . 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 Small fiber neuropathy 02/25/2018   Past Medical History:  Diagnosis Date  . Disc degeneration, lumbar   .  Piriformis syndrome   . RA (rheumatoid arthritis) (HCC)     Past Surgical History:  Procedure Laterality Date  . CESAREAN SECTION  1970  . TONSILLECTOMY     age 55  . TOTAL KNEE ARTHROPLASTY Right 06/10/2018   Procedure: TOTAL KNEE ARTHROPLASTY;  Surgeon: Durene Romans, MD;  Location: [facility];  Service: Orthopedics;  Laterality: Right;  90 mins    No current facility-administered medications for this encounter.    Current Outpatient Medications  Medication Sig Dispense Refill Last Dose  . Ascorbic Acid (VITAMIN C) 1000 MG tablet Take 1,000 mg by mouth 2 (two) times daily.     Marland Kitchen aspirin (ASPIRIN CHILDRENS) 81 MG chewable tablet Chew 1 tablet (81 mg total) by mouth 2 (two) times daily. Take for 4 weeks, then resume regular dose. 60 tablet 0   . Biotin (BIOTIN 5000) 5 MG CAPS Take 5 mg by mouth daily.    Taking  . Calcium Carbonate (CALCIUM 600 PO) Take 600 mg by mouth 2 (two) times daily.     . celecoxib (CELEBREX) 200 MG capsule Take 1 capsule (200 mg total) by mouth 2 (two) times daily. 60 capsule 0   . Cholecalciferol (VITAMIN D3) 5000 units CAPS Take 5,000 Units by mouth daily.     . cyanocobalamin 1000 MCG tablet Take 1,000 mcg by mouth 2 (two) times daily.   Taking  .  docusate sodium (COLACE) 100 MG capsule Take 1 capsule (100 mg total) by mouth 2 (two) times daily. (Patient taking differently: Take 100 mg by mouth 2 (two) times daily as needed (FOR CONSTIPATION). ) 10 capsule 0   . ferrous sulfate (FERROUSUL) 325 (65 FE) MG tablet Take 1 tablet (325 mg total) by mouth 3 (three) times daily with meals.  3   . folic acid (FOLVITE) 400 MCG tablet Take 400 mcg by mouth daily.     Marland Kitchen HYDROcodone-acetaminophen (NORCO) 7.5-325 MG tablet Take 1-2 tablets by mouth every 4 (four) hours as needed for moderate pain. 60 tablet 0   . LUTEIN-ZEAXANTHIN-BILBERRY PO Take 1 capsule by mouth daily. HERBAVISION     . Magnesium 500 MG CAPS Take 500 mg by mouth every evening.      . Melatonin 3 MG CAPS Take  3 mg by mouth at bedtime.     . Omega-3 Fatty Acids (FISH OIL) 1000 MG CAPS Take 1,000 mg by mouth 2 (two) times daily.    Taking  . polyethylene glycol (MIRALAX / GLYCOLAX) packet Take 17 g by mouth 2 (two) times daily. (Patient taking differently: Take 17 g by mouth at bedtime. ) 14 each 0   . predniSONE (DELTASONE) 5 MG tablet Take 5 mg by mouth daily.    06/10/2018 at 0700  . TURMERIC PO Take 1,800 mg by mouth 2 (two) times daily.   Taking  . vitamin E 400 UNIT capsule Take 400 Units by mouth daily.     Marland Kitchen zolpidem (AMBIEN) 5 MG tablet Take 5 mg by mouth at bedtime.    06/09/2018 at Unknown time  . methocarbamol (ROBAXIN) 500 MG tablet Take 1 tablet (500 mg total) by mouth every 6 (six) hours as needed for muscle spasms. (Patient not taking: Reported on 07/03/2018) 40 tablet 0 Not Taking at Unknown time  . promethazine (PHENERGAN) 12.5 MG tablet Take 1 tablet (12.5 mg total) by mouth every 6 (six) hours as needed for nausea or vomiting. (Patient not taking: Reported on 07/03/2018) 30 tablet 0 Not Taking at Unknown time   No Known Allergies  Social History   Tobacco Use  . Smoking status: Former Smoker    Last attempt to quit: 1974    Years since quitting: 45.6  . Smokeless tobacco: Never Used  Substance Use Topics  . Alcohol use: Not Currently    Comment: quit 1974    Family History  Problem Relation Age of Onset  . Breast cancer Maternal Aunt   . CAD Mother   . Cancer Father   . Neuropathy Neg Hx      Review of Systems  Constitutional: Negative.   HENT: Negative.   Eyes: Negative.   Respiratory: Negative.   Cardiovascular: Negative.   Gastrointestinal: Negative.   Genitourinary: Negative.   Musculoskeletal: Positive for joint pain.  Skin: Negative.   Neurological: Negative.   Endo/Heme/Allergies: Negative.   Psychiatric/Behavioral: The patient has insomnia.     Objective:  Physical Exam  Constitutional: She is oriented to person, place, and time. She appears  well-developed.  HENT:  Head: Normocephalic.  Eyes: Pupils are equal, round, and reactive to light.  Neck: Neck supple. No JVD present. No tracheal deviation present. No thyromegaly present.  Cardiovascular: Normal rate, regular rhythm and intact distal pulses.  Respiratory: Effort normal and breath sounds normal. No respiratory distress. She has no wheezes.  GI: Soft. There is no tenderness. There is no guarding.  Musculoskeletal:  Left knee: She exhibits decreased range of motion, swelling and bony tenderness. She exhibits no ecchymosis, no deformity, no laceration and no erythema. Tenderness found.  Lymphadenopathy:    She has no cervical adenopathy.  Neurological: She is alert and oriented to person, place, and time. A sensory deficit (neuropathy in bilateral feet) is present.  Skin: Skin is warm and dry.  Psychiatric: She has a normal mood and affect.     Labs:  Estimated body mass index is 25.79 kg/m as calculated from the following:   Height as of 06/10/18: 5\' 5"  (1.651 m).   Weight as of 06/10/18: 70.3 kg.   Imaging Review Plain radiographs demonstrate severe degenerative joint disease of the left knee(s). The bone quality appears to be good for age and reported activity level.   Preoperative templating of the joint replacement has been completed, documented, and submitted to the Operating Room personnel in order to optimize intra-operative equipment management.    Patient's anticipated LOS is less than 2 midnights, meeting these requirements: - Younger than 25 - Lives within 1 hour of care - Has a competent adult at home to recover with post-op recover - NO history of  - Chronic pain requiring opiods  - Diabetes  - Coronary Artery Disease  - Heart failure  - Heart attack  - Stroke  - DVT/VTE  - Cardiac arrhythmia  - Respiratory Failure/COPD  - Renal failure  - Anemia  - Advanced Liver disease        Assessment/Plan:  End stage arthritis, left  knee   The patient history, physical examination, clinical judgment of the provider and imaging studies are consistent with end stage degenerative joint disease of the left knee(s) and total knee arthroplasty is deemed medically necessary. The treatment options including medical management, injection therapy arthroscopy and arthroplasty were discussed at length. The risks and benefits of total knee arthroplasty were presented and reviewed. The risks due to aseptic loosening, infection, stiffness, patella tracking problems, thromboembolic complications and other imponderables were discussed. The patient acknowledged the explanation, agreed to proceed with the plan and consent was signed. Patient is being admitted for inpatient treatment for surgery, pain control, PT, OT, prophylactic antibiotics, VTE prophylaxis, progressive ambulation and ADL's and discharge planning. The patient is planning to be discharged home.    [address]   PA-C  07/07/2018, 10:09 PM

## 2018-07-09 NOTE — Progress Notes (Signed)
NEED ORDERS FOR PRE-OP ATT ON 07-10-18. THANKS

## 2018-07-09 NOTE — Patient Instructions (Addendum)
Pamela Pena  07/09/2018   Your procedure is scheduled on: 07-16-18   Report to [facility] Main  Entrance    Report to admitting at 7:30AM    Call this number if you have problems the morning of surgery [phone]     Remember: Do not eat food or drink liquids :After Midnight.     Take these medicines the morning of surgery with A SIP OF WATER: HYDROCODONE IF NEEDED, PREDNISONE, tylenol if needed                                 You may not have any metal on your body including hair pins and              piercings  Do not wear jewelry, make-up, lotions, powders or perfumes, deodorant             Do not wear nail polish.  Do not shave  48 hours prior to surgery.                 Do not bring valuables to the hospital. [facility] IS NOT             RESPONSIBLE   FOR VALUABLES.  Contacts, dentures or bridgework may not be worn into surgery.  Leave suitcase in the car. After surgery it may be brought to your room.                 Please read over the following fact sheets you were given: _____________________________________________________________________             [facility] - Preparing for Surgery Before surgery, you can play an important role.  Because skin is not sterile, your skin needs to be as free of germs as possible.  You can reduce the number of germs on your skin by washing with CHG (chlorahexidine gluconate) soap before surgery.  CHG is an antiseptic cleaner which kills germs and bonds with the skin to continue killing germs even after washing. Please DO NOT use if you have an allergy to CHG or antibacterial soaps.  If your skin becomes reddened/irritated stop using the CHG and inform your nurse when you arrive at Short Stay. Do not shave (including legs and underarms) for at least 48 hours prior to the first CHG shower.  You may shave your face/neck. Please follow these instructions carefully:  1.  Shower with CHG Soap the night before  surgery and the  morning of Surgery.  2.  If you choose to wash your hair, wash your hair first as usual with your  normal  shampoo.  3.  After you shampoo, rinse your hair and body thoroughly to remove the  shampoo.                           4.  Use CHG as you would any other liquid soap.  You can apply chg directly  to the skin and wash                       Gently with a scrungie or clean washcloth.  5.  Apply the CHG Soap to your body ONLY FROM THE NECK DOWN.   Do not use on face/ open  Wound or open sores. Avoid contact with eyes, ears mouth and genitals (private parts).                       Wash face,  Genitals (private parts) with your normal soap.             6.  Wash thoroughly, paying special attention to the area where your surgery  will be performed.  7.  Thoroughly rinse your body with warm water from the neck down.  8.  DO NOT shower/wash with your normal soap after using and rinsing off  the CHG Soap.                9.  Pat yourself dry with a clean towel.            10.  Wear clean pajamas.            11.  Place clean sheets on your bed the night of your first shower and do not  sleep with pets. Day of Surgery : Do not apply any lotions/deodorants the morning of surgery.  Please wear clean clothes to the hospital/surgery center.  FAILURE TO FOLLOW THESE INSTRUCTIONS MAY RESULT IN THE CANCELLATION OF YOUR SURGERY PATIENT SIGNATURE_________________________________  NURSE SIGNATURE__________________________________  ________________________________________________________________________   Pamela Pena  An incentive spirometer is a tool that can help keep your lungs clear and active. This tool measures how well you are filling your lungs with each breath. Taking long deep breaths may help reverse or decrease the chance of developing breathing (pulmonary) problems (especially infection) following:  A long period of time when you are unable to  move or be active. BEFORE THE PROCEDURE   If the spirometer includes an indicator to show your best effort, your nurse or respiratory therapist will set it to a desired goal.  If possible, sit up straight or lean slightly forward. Try not to slouch.  Hold the incentive spirometer in an upright position. INSTRUCTIONS FOR USE  1. Sit on the edge of your bed if possible, or sit up as far as you can in bed or on a chair. 2. Hold the incentive spirometer in an upright position. 3. Breathe out normally. 4. Place the mouthpiece in your mouth and seal your lips tightly around it. 5. Breathe in slowly and as deeply as possible, raising the piston or the ball toward the top of the column. 6. Hold your breath for 3-5 seconds or for as long as possible. Allow the piston or ball to fall to the bottom of the column. 7. Remove the mouthpiece from your mouth and breathe out normally. 8. Rest for a few seconds and repeat Steps 1 through 7 at least 10 times every 1-2 hours when you are awake. Take your time and take a few normal breaths between deep breaths. 9. The spirometer may include an indicator to show your best effort. Use the indicator as a goal to work toward during each repetition. 10. After each set of 10 deep breaths, practice coughing to be sure your lungs are clear. If you have an incision (the cut made at the time of surgery), support your incision when coughing by placing a pillow or rolled up towels firmly against it. Once you are able to get out of bed, walk around indoors and cough well. You may stop using the incentive spirometer when instructed by your caregiver.  RISKS AND COMPLICATIONS  Take your time so you do not get  dizzy or light-headed.  If you are in pain, you may need to take or ask for pain medication before doing incentive spirometry. It is harder to take a deep breath if you are having pain. AFTER USE  Rest and breathe slowly and easily.  It can be helpful to keep track of  a log of your progress. Your caregiver can provide you with a simple table to help with this. If you are using the spirometer at home, follow these instructions: [place] IF:   You are having difficultly using the spirometer.  You have trouble using the spirometer as often as instructed.  Your pain medication is not giving enough relief while using the spirometer.  You develop fever of 100.5 F (38.1 C) or higher. SEEK IMMEDIATE MEDICAL CARE IF:   You cough up bloody sputum that had not been present before.  You develop fever of 102 F (38.9 C) or greater.  You develop worsening pain at or near the incision site. MAKE SURE YOU:   Understand these instructions.  Will watch your condition.  Will get help right away if you are not doing well or get worse. Document Released: 03/26/2007 Document Revised: 02/05/2012 Document Reviewed: 05/27/2007 Exit[employer] Patient Information 2014 Exit[employer], [place].   ________________________________________________________________________  WHAT IS A BLOOD TRANSFUSION? Blood Transfusion Information  A transfusion is the replacement of blood or some of its parts. Blood is made up of multiple cells which provide different functions.  Red blood cells carry oxygen and are used for blood loss replacement.  White blood cells fight against infection.  Platelets control bleeding.  Plasma helps clot blood.  Other blood products are available for specialized needs, such as hemophilia or other clotting disorders. BEFORE THE TRANSFUSION  Who gives blood for transfusions?   Healthy volunteers who are fully evaluated to make sure their blood is safe. This is blood bank blood. Transfusion therapy is the safest it has ever been in the practice of medicine. Before blood is taken from a donor, a complete history is taken to make sure that person has no history of diseases nor engages in risky social behavior (examples are intravenous drug use or sexual  activity with multiple partners). The donor's travel history is screened to minimize risk of transmitting infections, such as malaria. The donated blood is tested for signs of infectious diseases, such as HIV and hepatitis. The blood is then tested to be sure it is compatible with you in order to minimize the chance of a transfusion reaction. If you or a relative donates blood, this is often done in anticipation of surgery and is not appropriate for emergency situations. It takes many days to process the donated blood. RISKS AND COMPLICATIONS Although transfusion therapy is very safe and saves many lives, the main dangers of transfusion include:   Getting an infectious disease.  Developing a transfusion reaction. This is an allergic reaction to something in the blood you were given. Every precaution is taken to prevent this. The decision to have a blood transfusion has been considered carefully by your caregiver before blood is given. Blood is not given unless the benefits outweigh the risks. AFTER THE TRANSFUSION  Right after receiving a blood transfusion, you will usually feel much better and more energetic. This is especially true if your red blood cells have gotten low (anemic). The transfusion raises the level of the red blood cells which carry oxygen, and this usually causes an energy increase.  The nurse administering the transfusion will  monitor you carefully for complications. HOME CARE INSTRUCTIONS  No special instructions are needed after a transfusion. You may find your energy is better. Speak with your caregiver about any limitations on activity for underlying diseases you may have. SEEK MEDICAL CARE IF:   Your condition is not improving after your transfusion.  You develop redness or irritation at the intravenous (IV) site. SEEK IMMEDIATE MEDICAL CARE IF:  Any of the following symptoms occur over the next 12 hours:  Shaking chills.  You have a temperature by mouth above 102 F  (38.9 C), not controlled by medicine.  Chest, back, or muscle pain.  People around you feel you are not acting correctly or are confused.  Shortness of breath or difficulty breathing.  Dizziness and fainting.  You get a rash or develop hives.  You have a decrease in urine output.  Your urine turns a dark color or changes to pink, red, or brown. Any of the following symptoms occur over the next 10 days:  You have a temperature by mouth above 102 F (38.9 C), not controlled by medicine.  Shortness of breath.  Weakness after normal activity.  The white part of the eye turns yellow (jaundice).  You have a decrease in the amount of urine or are urinating less often.  Your urine turns a dark color or changes to pink, red, or brown. Document Released: 11/10/2000 Document Revised: 02/05/2012 Document Reviewed: 06/29/2008 [facility] Patient Information 2014 [place], [place].  _______________________________________________________________________

## 2018-07-10 ENCOUNTER — Other Ambulatory Visit: Payer: Self-pay

## 2018-07-10 ENCOUNTER — Encounter (HOSPITAL_COMMUNITY)
Admission: RE | Admit: 2018-07-10 | Discharge: 2018-07-10 | Disposition: A | Discharge status: Home / Self Care | Payer: 59 | Source: Ambulatory Visit | Attending: Orthopedic Surgery | Admitting: Orthopedic Surgery

## 2018-07-10 DIAGNOSIS — Z01812 Encounter for preprocedural laboratory examination: Secondary | ICD-10-CM | POA: Insufficient documentation

## 2018-07-10 LAB — CBC
HCT: 41.1 % (ref 36.0–46.0)
Hemoglobin: 13.3 g/dL (ref 12.0–15.0)
MCH: 34 pg (ref 26.0–34.0)
MCHC: 32.4 g/dL (ref 30.0–36.0)
MCV: 105.1 fL — AB (ref 78.0–100.0)
PLATELETS: 294 10*3/uL (ref 150–400)
RBC: 3.91 MIL/uL (ref 3.87–5.11)
RDW: 14 % (ref 11.5–15.5)
WBC: 6.4 10*3/uL (ref 4.0–10.5)

## 2018-07-10 LAB — SURGICAL PCR SCREEN
MRSA, PCR: NEGATIVE
Staphylococcus aureus: NEGATIVE

## 2018-07-15 NOTE — Anesthesia Preprocedure Evaluation (Addendum)
Anesthesia Evaluation  Patient identified by MRN, date of birth, ID band Patient awake    Reviewed: Allergy & Precautions, NPO status , Patient's Chart, lab work & pertinent test results  Airway Mallampati: II  TM Distance: >3 FB Neck ROM: Full    Dental no notable dental hx. (+) Dental Advisory Given, Partial Upper   Pulmonary former smoker,    Pulmonary exam normal breath sounds clear to auscultation       Cardiovascular Exercise Tolerance: Good Normal cardiovascular exam Rhythm:Regular Rate:Normal     Neuro/Psych  Neuromuscular disease negative psych ROS   GI/Hepatic negative GI ROS, Neg liver ROS,   Endo/Other  negative endocrine ROS  Renal/GU negative Renal ROS     Musculoskeletal  (+) Arthritis , Osteoarthritis,    Abdominal   Peds  Hematology negative hematology ROS (+)   Anesthesia Other Findings L TkR  Reproductive/Obstetrics                          Anesthesia Physical Anesthesia Plan  ASA: I  Anesthesia Plan: Spinal and Regional   Post-op Pain Management:    Induction:   PONV Risk Score and Plan: Treatment may vary due to age or medical condition, Ondansetron and Dexamethasone  Airway Management Planned: Natural Airway and Nasal Cannula  Additional Equipment:   Intra-op Plan:   Post-operative Plan:   Informed Consent: I have reviewed the patients History and Physical, chart, labs and discussed the procedure including the risks, benefits and alternatives for the proposed anesthesia with the patient or authorized representative who has indicated his/her understanding and acceptance.   Dental advisory given  Plan Discussed with: CRNA  Anesthesia Plan Comments:         Anesthesia Quick Evaluation

## 2018-07-16 ENCOUNTER — Ambulatory Visit (HOSPITAL_COMMUNITY): Payer: 59 | Admitting: Anesthesiology

## 2018-07-16 ENCOUNTER — Observation Stay (HOSPITAL_COMMUNITY)
Admission: RE | Admit: 2018-07-16 | Discharge: 2018-07-17 | Disposition: A | Discharge status: Home / Self Care | Payer: 59 | Source: Ambulatory Visit | Attending: Orthopedic Surgery | Admitting: Orthopedic Surgery

## 2018-07-16 ENCOUNTER — Other Ambulatory Visit: Payer: Self-pay

## 2018-07-16 ENCOUNTER — Encounter (HOSPITAL_COMMUNITY): Payer: Self-pay | Admitting: Anesthesiology

## 2018-07-16 ENCOUNTER — Encounter (HOSPITAL_COMMUNITY)
Admission: RE | Disposition: A | Discharge status: Home / Self Care | Payer: Self-pay | Source: Ambulatory Visit | Attending: Orthopedic Surgery

## 2018-07-16 DIAGNOSIS — Z791 Long term (current) use of non-steroidal anti-inflammatories (NSAID): Secondary | ICD-10-CM | POA: Diagnosis not present

## 2018-07-16 DIAGNOSIS — Z7982 Long term (current) use of aspirin: Secondary | ICD-10-CM | POA: Diagnosis not present

## 2018-07-16 DIAGNOSIS — Z79891 Long term (current) use of opiate analgesic: Secondary | ICD-10-CM | POA: Diagnosis not present

## 2018-07-16 DIAGNOSIS — M069 Rheumatoid arthritis, unspecified: Secondary | ICD-10-CM | POA: Diagnosis not present

## 2018-07-16 DIAGNOSIS — M1712 Unilateral primary osteoarthritis, left knee: Secondary | ICD-10-CM | POA: Diagnosis not present

## 2018-07-16 DIAGNOSIS — Z6826 Body mass index (BMI) 26.0-26.9, adult: Secondary | ICD-10-CM | POA: Diagnosis not present

## 2018-07-16 DIAGNOSIS — Z96651 Presence of right artificial knee joint: Secondary | ICD-10-CM | POA: Insufficient documentation

## 2018-07-16 DIAGNOSIS — Z7952 Long term (current) use of systemic steroids: Secondary | ICD-10-CM | POA: Diagnosis not present

## 2018-07-16 DIAGNOSIS — G629 Polyneuropathy, unspecified: Secondary | ICD-10-CM | POA: Diagnosis not present

## 2018-07-16 DIAGNOSIS — Z87891 Personal history of nicotine dependence: Secondary | ICD-10-CM | POA: Diagnosis not present

## 2018-07-16 DIAGNOSIS — Z96659 Presence of unspecified artificial knee joint: Secondary | ICD-10-CM

## 2018-07-16 DIAGNOSIS — Z96652 Presence of left artificial knee joint: Secondary | ICD-10-CM

## 2018-07-16 DIAGNOSIS — E663 Overweight: Secondary | ICD-10-CM | POA: Diagnosis present

## 2018-07-16 HISTORY — PX: TOTAL KNEE ARTHROPLASTY: SHX125

## 2018-07-16 LAB — TYPE AND SCREEN
ABO/RH(D): O NEG
ANTIBODY SCREEN: NEGATIVE

## 2018-07-16 SURGERY — ARTHROPLASTY, KNEE, TOTAL
Anesthesia: Regional | Site: Knee | Laterality: Left

## 2018-07-16 MED ORDER — MENTHOL 3 MG MT LOZG: 1.0000 | LOZENGE | OROMUCOSAL | Status: DC | PRN

## 2018-07-16 MED ORDER — CEFAZOLIN SODIUM-DEXTROSE 2-4 GM/100ML-% IV SOLN
2.0000 g | Freq: Four times a day (QID) | INTRAVENOUS | Status: AC
  Administered 2018-07-16 (×2): 2 g via INTRAVENOUS
  Filled 2018-07-16 (×2): qty 100

## 2018-07-16 MED ORDER — PROPOFOL 10 MG/ML IV BOLUS
INTRAVENOUS | Status: AC
  Filled 2018-07-16: qty 40

## 2018-07-16 MED ORDER — METHOCARBAMOL 500 MG PO TABS
500.0000 mg | ORAL_TABLET | Freq: Four times a day (QID) | ORAL | Status: DC | PRN
  Administered 2018-07-16 – 2018-07-17 (×3): 500 mg via ORAL
  Filled 2018-07-16 (×3): qty 1

## 2018-07-16 MED ORDER — TRANEXAMIC ACID 1000 MG/10ML IV SOLN: 1000.0000 mg | INTRAVENOUS | Status: DC

## 2018-07-16 MED ORDER — ONDANSETRON HCL 4 MG/2ML IJ SOLN
INTRAMUSCULAR | Status: DC | PRN
  Administered 2018-07-16: 4 mg via INTRAVENOUS

## 2018-07-16 MED ORDER — SODIUM CHLORIDE 0.9 % IJ SOLN
INTRAMUSCULAR | Status: AC
  Filled 2018-07-16: qty 50

## 2018-07-16 MED ORDER — ACETAMINOPHEN 325 MG PO TABS: 325.0000 mg | ORAL_TABLET | Freq: Four times a day (QID) | ORAL | Status: DC | PRN

## 2018-07-16 MED ORDER — PREDNISONE 5 MG PO TABS
5.0000 mg | ORAL_TABLET | Freq: Every day | ORAL | Status: DC
  Administered 2018-07-17: 5 mg via ORAL
  Filled 2018-07-16: qty 1

## 2018-07-16 MED ORDER — 0.9 % SODIUM CHLORIDE (POUR BTL) OPTIME
TOPICAL | Status: DC | PRN
  Administered 2018-07-16: 1000 mL

## 2018-07-16 MED ORDER — MAGNESIUM CITRATE PO SOLN: 1.0000 | Freq: Once | ORAL | Status: DC | PRN

## 2018-07-16 MED ORDER — METOCLOPRAMIDE HCL 5 MG PO TABS: 5.0000 mg | ORAL_TABLET | Freq: Three times a day (TID) | ORAL | Status: DC | PRN

## 2018-07-16 MED ORDER — CHLORHEXIDINE GLUCONATE 4 % EX LIQD: 60.0000 mL | Freq: Once | CUTANEOUS | Status: DC

## 2018-07-16 MED ORDER — METHOTREXATE 2.5 MG PO TABS: 2.5000 mg | ORAL_TABLET | ORAL | 0 refills | Status: AC

## 2018-07-16 MED ORDER — HYDROCODONE-ACETAMINOPHEN 7.5-325 MG PO TABS
1.0000 | ORAL_TABLET | ORAL | Status: DC | PRN
  Administered 2018-07-16 – 2018-07-17 (×5): 2 via ORAL
  Filled 2018-07-16 (×5): qty 2

## 2018-07-16 MED ORDER — BUPIVACAINE-EPINEPHRINE (PF) 0.25% -1:200000 IJ SOLN
INTRAMUSCULAR | Status: DC | PRN
  Administered 2018-07-16: 30 mL

## 2018-07-16 MED ORDER — PHENYLEPHRINE 40 MCG/ML (10ML) SYRINGE FOR IV PUSH (FOR BLOOD PRESSURE SUPPORT)
PREFILLED_SYRINGE | INTRAVENOUS | Status: AC
  Filled 2018-07-16: qty 10

## 2018-07-16 MED ORDER — METHOCARBAMOL 500 MG IVPB - SIMPLE MED
500.0000 mg | Freq: Four times a day (QID) | INTRAVENOUS | Status: DC | PRN
  Filled 2018-07-16: qty 50

## 2018-07-16 MED ORDER — PHENYLEPHRINE 40 MCG/ML (10ML) SYRINGE FOR IV PUSH (FOR BLOOD PRESSURE SUPPORT)
PREFILLED_SYRINGE | INTRAVENOUS | Status: DC | PRN
  Administered 2018-07-16 (×8): 40 ug via INTRAVENOUS
  Administered 2018-07-16: 80 ug via INTRAVENOUS

## 2018-07-16 MED ORDER — KETOROLAC TROMETHAMINE 30 MG/ML IJ SOLN
INTRAMUSCULAR | Status: AC
  Filled 2018-07-16: qty 1

## 2018-07-16 MED ORDER — SODIUM CHLORIDE 0.9 % IV SOLN
INTRAVENOUS | Status: DC
  Administered 2018-07-16 (×2): via INTRAVENOUS

## 2018-07-16 MED ORDER — ASPIRIN 81 MG PO CHEW: 81.0000 mg | CHEWABLE_TABLET | Freq: Two times a day (BID) | ORAL | 0 refills | Status: AC

## 2018-07-16 MED ORDER — EPHEDRINE SULFATE-NACL 50-0.9 MG/10ML-% IV SOSY
PREFILLED_SYRINGE | INTRAVENOUS | Status: DC | PRN
  Administered 2018-07-16 (×3): 5 mg via INTRAVENOUS

## 2018-07-16 MED ORDER — TRANEXAMIC ACID 1000 MG/10ML IV SOLN
1000.0000 mg | Freq: Once | INTRAVENOUS | Status: AC
  Administered 2018-07-16: 1000 mg via INTRAVENOUS
  Filled 2018-07-16: qty 1000

## 2018-07-16 MED ORDER — CELECOXIB 200 MG PO CAPS
200.0000 mg | ORAL_CAPSULE | Freq: Two times a day (BID) | ORAL | Status: DC
  Administered 2018-07-16 – 2018-07-17 (×2): 200 mg via ORAL
  Filled 2018-07-16 (×2): qty 1

## 2018-07-16 MED ORDER — HYDROMORPHONE HCL 1 MG/ML IJ SOLN: 0.2500 mg | INTRAMUSCULAR | Status: DC | PRN

## 2018-07-16 MED ORDER — CEFAZOLIN SODIUM-DEXTROSE 2-4 GM/100ML-% IV SOLN
2.0000 g | Freq: Once | INTRAVENOUS | Status: AC
  Administered 2018-07-16: 2 g via INTRAVENOUS

## 2018-07-16 MED ORDER — BISACODYL 10 MG RE SUPP: 10.0000 mg | Freq: Every day | RECTAL | Status: DC | PRN

## 2018-07-16 MED ORDER — ONDANSETRON HCL 4 MG/2ML IJ SOLN: 4.0000 mg | Freq: Four times a day (QID) | INTRAMUSCULAR | Status: DC | PRN

## 2018-07-16 MED ORDER — CEFAZOLIN SODIUM-DEXTROSE 2-4 GM/100ML-% IV SOLN: 2.0000 g | INTRAVENOUS | Status: DC

## 2018-07-16 MED ORDER — SODIUM CHLORIDE 0.9 % IR SOLN
Status: DC | PRN
  Administered 2018-07-16: 1000 mL

## 2018-07-16 MED ORDER — EPHEDRINE 5 MG/ML INJ
INTRAVENOUS | Status: AC
  Filled 2018-07-16: qty 20

## 2018-07-16 MED ORDER — BUPIVACAINE-EPINEPHRINE (PF) 0.25% -1:200000 IJ SOLN
INTRAMUSCULAR | Status: AC
  Filled 2018-07-16: qty 30

## 2018-07-16 MED ORDER — DIPHENHYDRAMINE HCL 12.5 MG/5ML PO ELIX
12.5000 mg | ORAL_SOLUTION | ORAL | Status: DC | PRN
  Administered 2018-07-16: 25 mg via ORAL
  Filled 2018-07-16: qty 10

## 2018-07-16 MED ORDER — MIDAZOLAM HCL 2 MG/2ML IJ SOLN
1.0000 mg | INTRAMUSCULAR | Status: DC
Start: 2018-07-16 — End: 2018-07-17
  Administered 2018-07-16: 1 mg via INTRAVENOUS
  Administered 2018-07-16: 0.5 mg via INTRAVENOUS
  Administered 2018-07-16: 1.5 mg via INTRAVENOUS

## 2018-07-16 MED ORDER — PROPOFOL 500 MG/50ML IV EMUL
INTRAVENOUS | Status: DC | PRN
  Administered 2018-07-16: 75 ug/kg/min via INTRAVENOUS

## 2018-07-16 MED ORDER — TRANEXAMIC ACID 1000 MG/10ML IV SOLN
1000.0000 mg | INTRAVENOUS | Status: AC
  Administered 2018-07-16: 1000 mg via INTRAVENOUS

## 2018-07-16 MED ORDER — PROPOFOL 10 MG/ML IV BOLUS
INTRAVENOUS | Status: DC | PRN
  Administered 2018-07-16: 50 mg via INTRAVENOUS

## 2018-07-16 MED ORDER — MORPHINE SULFATE (PF) 4 MG/ML IV SOLN: 0.5000 mg | INTRAVENOUS | Status: DC | PRN

## 2018-07-16 MED ORDER — SODIUM CHLORIDE 0.9 % IJ SOLN
INTRAMUSCULAR | Status: DC | PRN
  Administered 2018-07-16: 29 mL

## 2018-07-16 MED ORDER — ZOLPIDEM TARTRATE 5 MG PO TABS
5.0000 mg | ORAL_TABLET | Freq: Every day | ORAL | Status: DC
  Administered 2018-07-16: 5 mg via ORAL
  Filled 2018-07-16: qty 1

## 2018-07-16 MED ORDER — ALUM & MAG HYDROXIDE-SIMETH 200-200-20 MG/5ML PO SUSP: 15.0000 mL | ORAL | Status: DC | PRN

## 2018-07-16 MED ORDER — MEPERIDINE HCL 50 MG/ML IJ SOLN: 6.2500 mg | INTRAMUSCULAR | Status: DC | PRN

## 2018-07-16 MED ORDER — FENTANYL CITRATE (PF) 100 MCG/2ML IJ SOLN
50.0000 ug | INTRAMUSCULAR | Status: DC
Start: 2018-07-16 — End: 2018-07-17
  Administered 2018-07-16: 50 ug via INTRAVENOUS

## 2018-07-16 MED ORDER — DOCUSATE SODIUM 100 MG PO CAPS
100.0000 mg | ORAL_CAPSULE | Freq: Two times a day (BID) | ORAL | Status: DC
  Administered 2018-07-17: 100 mg via ORAL
  Filled 2018-07-16 (×2): qty 1

## 2018-07-16 MED ORDER — DEXAMETHASONE SODIUM PHOSPHATE 10 MG/ML IJ SOLN
10.0000 mg | Freq: Once | INTRAMUSCULAR | Status: AC
  Administered 2018-07-17: 10 mg via INTRAVENOUS
  Filled 2018-07-16: qty 1

## 2018-07-16 MED ORDER — TRANEXAMIC ACID 1000 MG/10ML IV SOLN
INTRAVENOUS | Status: AC
  Filled 2018-07-16: qty 10

## 2018-07-16 MED ORDER — HYDROCODONE-ACETAMINOPHEN 7.5-325 MG PO TABS: 1.0000 | ORAL_TABLET | Freq: Once | ORAL | Status: DC | PRN

## 2018-07-16 MED ORDER — ASPIRIN 81 MG PO CHEW
81.0000 mg | CHEWABLE_TABLET | Freq: Two times a day (BID) | ORAL | Status: DC
  Administered 2018-07-16 – 2018-07-17 (×2): 81 mg via ORAL
  Filled 2018-07-16 (×2): qty 1

## 2018-07-16 MED ORDER — LACTATED RINGERS IV SOLN
INTRAVENOUS | Status: DC
Start: 2018-07-16 — End: 2018-07-17
  Administered 2018-07-16 (×2): via INTRAVENOUS

## 2018-07-16 MED ORDER — ACETAMINOPHEN 10 MG/ML IV SOLN: 1000.0000 mg | Freq: Once | INTRAVENOUS | Status: DC | PRN

## 2018-07-16 MED ORDER — ONDANSETRON HCL 4 MG/2ML IJ SOLN: 4.0000 mg | Freq: Once | INTRAMUSCULAR | Status: DC | PRN

## 2018-07-16 MED ORDER — ONDANSETRON HCL 4 MG PO TABS: 4.0000 mg | ORAL_TABLET | Freq: Four times a day (QID) | ORAL | Status: DC | PRN

## 2018-07-16 MED ORDER — FENTANYL CITRATE (PF) 100 MCG/2ML IJ SOLN
INTRAMUSCULAR | Status: AC
  Administered 2018-07-16: 50 ug via INTRAVENOUS
  Filled 2018-07-16: qty 2

## 2018-07-16 MED ORDER — METOCLOPRAMIDE HCL 5 MG/ML IJ SOLN: 5.0000 mg | Freq: Three times a day (TID) | INTRAMUSCULAR | Status: DC | PRN

## 2018-07-16 MED ORDER — MIDAZOLAM HCL 2 MG/2ML IJ SOLN
INTRAMUSCULAR | Status: AC
  Administered 2018-07-16: 1 mg via INTRAVENOUS
  Filled 2018-07-16: qty 2

## 2018-07-16 MED ORDER — PHENOL 1.4 % MT LIQD
1.0000 | OROMUCOSAL | Status: DC | PRN
  Filled 2018-07-16: qty 177

## 2018-07-16 MED ORDER — KETOROLAC TROMETHAMINE 30 MG/ML IJ SOLN
INTRAMUSCULAR | Status: DC | PRN
  Administered 2018-07-16: 30 mg

## 2018-07-16 MED ORDER — STERILE WATER FOR IRRIGATION IR SOLN
Status: DC | PRN
  Administered 2018-07-16: 2000 mL

## 2018-07-16 MED ORDER — POLYETHYLENE GLYCOL 3350 17 G PO PACK
17.0000 g | PACK | Freq: Two times a day (BID) | ORAL | Status: DC
  Administered 2018-07-17: 17 g via ORAL
  Filled 2018-07-16: qty 1

## 2018-07-16 MED ORDER — HYDROCODONE-ACETAMINOPHEN 5-325 MG PO TABS
1.0000 | ORAL_TABLET | ORAL | Status: DC | PRN
  Filled 2018-07-16: qty 2

## 2018-07-16 MED ORDER — CEFAZOLIN SODIUM-DEXTROSE 2-4 GM/100ML-% IV SOLN
INTRAVENOUS | Status: AC
  Filled 2018-07-16: qty 100

## 2018-07-16 MED ORDER — DEXAMETHASONE SODIUM PHOSPHATE 10 MG/ML IJ SOLN
10.0000 mg | Freq: Once | INTRAMUSCULAR | Status: AC
  Administered 2018-07-16: 10 mg via INTRAVENOUS

## 2018-07-16 MED ORDER — BUPIVACAINE IN DEXTROSE 0.75-8.25 % IT SOLN
INTRATHECAL | Status: DC | PRN
  Administered 2018-07-16: 2 mL via INTRATHECAL

## 2018-07-16 MED ORDER — MIDAZOLAM HCL 2 MG/2ML IJ SOLN
INTRAMUSCULAR | Status: AC
  Filled 2018-07-16: qty 2

## 2018-07-16 MED ORDER — PROPOFOL 10 MG/ML IV BOLUS
INTRAVENOUS | Status: AC
  Filled 2018-07-16: qty 20

## 2018-07-16 MED ORDER — ROPIVACAINE HCL 5 MG/ML IJ SOLN
INTRAMUSCULAR | Status: DC | PRN
  Administered 2018-07-16: 30 mL via PERINEURAL

## 2018-07-16 MED ORDER — FERROUS SULFATE 325 (65 FE) MG PO TABS
325.0000 mg | ORAL_TABLET | Freq: Two times a day (BID) | ORAL | Status: DC
  Administered 2018-07-17: 325 mg via ORAL
  Filled 2018-07-16: qty 1

## 2018-07-16 SURGICAL SUPPLY — 53 items
ATTUNE MED ANAT PAT 35 KNEE (Knees) ×2 IMPLANT
ATTUNE MED ANAT PAT 35MM KNEE (Knees) ×1 IMPLANT
ATTUNE PSFEM LTSZ4 NARCEM KNEE (Femur) ×3 IMPLANT
ATTUNE PSRP INSR SZ4 7 KNEE (Insert) ×2 IMPLANT
ATTUNE PSRP INSR SZ4 7MM KNEE (Insert) ×1 IMPLANT
BAG SPEC THK2 15X12 ZIP CLS (MISCELLANEOUS) ×1
BAG ZIPLOCK 12X15 (MISCELLANEOUS) ×3 IMPLANT
BANDAGE ACE 6X5 VEL STRL LF (GAUZE/BANDAGES/DRESSINGS) ×3 IMPLANT
BASEPLATE TIBIAL ROTATING SZ 4 (Knees) ×3 IMPLANT
BLADE SAW SGTL 11.0X1.19X90.0M (BLADE) ×3 IMPLANT
BLADE SAW SGTL 13.0X1.19X90.0M (BLADE) ×3 IMPLANT
BOWL SMART MIX CTS (DISPOSABLE) ×3 IMPLANT
BSPLAT TIB 4 CMNT ROT PLAT STR (Knees) ×1 IMPLANT
CEMENT HV SMART SET (Cement) ×6 IMPLANT
COVER SURGICAL LIGHT HANDLE (MISCELLANEOUS) ×3 IMPLANT
CUFF TOURN SGL QUICK 34 (TOURNIQUET CUFF) ×2
CUFF TRNQT CYL 34X4X40X1 (TOURNIQUET CUFF) ×1 IMPLANT
DECANTER SPIKE VIAL GLASS SM (MISCELLANEOUS) ×6 IMPLANT
DERMABOND ADVANCED (GAUZE/BANDAGES/DRESSINGS) ×2
DERMABOND ADVANCED .7 DNX12 (GAUZE/BANDAGES/DRESSINGS) ×1 IMPLANT
DRAPE U-SHAPE 47X51 STRL (DRAPES) ×3 IMPLANT
DRESSING AQUACEL AG SP 3.5X10 (GAUZE/BANDAGES/DRESSINGS) ×1 IMPLANT
DRSG AQUACEL AG SP 3.5X10 (GAUZE/BANDAGES/DRESSINGS) ×3
DURAPREP 26ML APPLICATOR (WOUND CARE) ×9 IMPLANT
ELECT REM PT RETURN 15FT ADLT (MISCELLANEOUS) ×3 IMPLANT
GLOVE BIOGEL PI IND STRL 7.0 (GLOVE) ×2 IMPLANT
GLOVE BIOGEL PI IND STRL 7.5 (GLOVE) ×7 IMPLANT
GLOVE BIOGEL PI IND STRL 8.5 (GLOVE) ×1 IMPLANT
GLOVE BIOGEL PI INDICATOR 7.0 (GLOVE) ×4
GLOVE BIOGEL PI INDICATOR 7.5 (GLOVE) ×14
GLOVE BIOGEL PI INDICATOR 8.5 (GLOVE) ×2
GLOVE ECLIPSE 8.0 STRL XLNG CF (GLOVE) ×9 IMPLANT
GLOVE ORTHO TXT STRL SZ7.5 (GLOVE) ×3 IMPLANT
GOWN STRL REUS W/TWL 2XL LVL3 (GOWN DISPOSABLE) ×6 IMPLANT
GOWN STRL REUS W/TWL LRG LVL3 (GOWN DISPOSABLE) ×3 IMPLANT
GOWN STRL REUS W/TWL XL LVL3 (GOWN DISPOSABLE) ×6 IMPLANT
HANDPIECE INTERPULSE COAX TIP (DISPOSABLE) ×2
HOLDER FOLEY CATH W/STRAP (MISCELLANEOUS) ×3 IMPLANT
MANIFOLD NEPTUNE II (INSTRUMENTS) ×3 IMPLANT
PACK TOTAL KNEE CUSTOM (KITS) ×3 IMPLANT
PIN THREADED HEADED SIGMA (PIN) ×3 IMPLANT
POSITIONER SURGICAL ARM (MISCELLANEOUS) ×3 IMPLANT
SET HNDPC FAN SPRY TIP SCT (DISPOSABLE) ×1 IMPLANT
SET PAD KNEE POSITIONER (MISCELLANEOUS) ×3 IMPLANT
SUT MNCRL AB 4-0 PS2 18 (SUTURE) ×3 IMPLANT
SUT STRATAFIX PDS+ 0 24IN (SUTURE) ×3 IMPLANT
SUT VIC AB 1 CT1 36 (SUTURE) ×3 IMPLANT
SUT VIC AB 2-0 CT1 27 (SUTURE) ×9
SUT VIC AB 2-0 CT1 TAPERPNT 27 (SUTURE) ×3 IMPLANT
SYRINGE 3CC LL L/F (MISCELLANEOUS) ×3 IMPLANT
TRAY FOLEY CATH 14FR (SET/KITS/TRAYS/PACK) ×3 IMPLANT
WRAP KNEE MAXI GEL POST OP (GAUZE/BANDAGES/DRESSINGS) ×3 IMPLANT
YANKAUER SUCT BULB TIP 10FT TU (MISCELLANEOUS) ×3 IMPLANT

## 2018-07-16 NOTE — Anesthesia Procedure Notes (Signed)
Spinal  Patient location during procedure: OR Start time: 07/16/2018 9:45 AM End time: 07/16/2018 9:51 AM Staffing Resident/CRNA: Theodosia Quay, CRNA Performed: resident/CRNA  Preanesthetic Checklist Completed: patient identified, site marked, surgical consent, pre-op evaluation, timeout performed, IV checked, risks and benefits discussed and monitors and equipment checked Spinal Block Patient position: sitting Prep: Betadine Patient monitoring: heart rate, cardiac monitor, continuous pulse ox and blood pressure Approach: midline Location: L4-5 Injection technique: single-shot Needle Needle type: Pencan  Needle gauge: 24 G Needle length: 9 cm Needle insertion depth: 7 cm Assessment Sensory level: T6 Additional Notes -heme, -para, VSS.  Pt tolerated well.  VSS.  Lot and expiration date OK.

## 2018-07-16 NOTE — Transfer of Care (Signed)
Immediate Anesthesia Transfer of Care Note  Patient: Pamela Pena  Procedure(s) Performed: LEFT TOTAL KNEE ARTHROPLASTY (Left Knee)  Patient Location: PACU  Anesthesia Type:Spinal and MAC combined with regional for post-op pain  Level of Consciousness: awake, alert , oriented and patient cooperative  Airway & Oxygen Therapy: Patient Spontanous Breathing and Patient connected to face mask oxygen  Post-op Assessment: Report given to RN and Post -op Vital signs reviewed and stable  Post vital signs: Reviewed and stable  Last Vitals:  Vitals Value Taken Time  BP 125/55 07/16/2018 11:36 AM  Temp    Pulse 75 07/16/2018 11:38 AM  Resp 15 07/16/2018 11:38 AM  SpO2 100 % 07/16/2018 11:38 AM  Vitals shown include unvalidated device data.  Last Pain: There were no vitals filed for this visit.    Patients Stated Pain Goal: 2 (45/62/56 3893)  Complications: No apparent anesthesia complications

## 2018-07-16 NOTE — Progress Notes (Signed)
AssistedDr. Houser with left, ultrasound guided, adductor canal block. Side rails up, monitors on throughout procedure. See vital signs in flow sheet. Tolerated Procedure well.  

## 2018-07-16 NOTE — Evaluation (Signed)
Physical Therapy Evaluation Patient Details Name: Pamela Pena MRN: [id number] DOB: 05-31-1951 Today's Date: 07/16/2018   History of Present Illness  67 YO female s/p L TKR on 07/16/18. PMH includes RA, piriformis syndrome, lumbar DDD, small fiber neuropathy. Past surgical history includes R TKR 05/2018.   Clinical Impression  Pt is a 67 YO female s/p L TKR on 07/16/18. PMH as listed above, notably pt had R TKR in July 2019. Pt presents with post-operative L knee pain and weakness, difficulty with transfers/ambulation. Pt would benefit from acute PT to address deficits. Pt ambulated 75 ft with min guard assist with RW, and pt was reluctant to shift weight to LLE. Will continue to progress mobility as tolerated. Pt plans to go home with OPPT. Will continue to follow acutely.     Follow Up Recommendations Follow surgeon's recommendation for DC plan and follow-up therapies;Supervision for mobility/OOB(OPPT )    Equipment Recommendations  None recommended by PT    Recommendations for Other Services       Precautions / Restrictions Precautions Precautions: Knee;Fall Restrictions Weight Bearing Restrictions: No RLE Weight Bearing: Weight bearing as tolerated      Mobility  Bed Mobility Overal bed mobility: Needs Assistance Bed Mobility: Supine to Sit     Supine to sit: Min guard     General bed mobility comments: Verbal cuing for scooting to EOB, min guard for safety.   Transfers Overall transfer level: Needs assistance Equipment used: Rolling walker (2 wheeled) Transfers: Sit to/from Stand Sit to Stand: Min assist         General transfer comment: Min assist for power up. Pt steadied herself upon standing. Pt did not require verbal cuing for hand placement, pushed with both hands from bed and once standing, utilized RW for UE support.   Ambulation/Gait Ambulation/Gait assistance: Min guard Gait Distance (Feet): 75 Feet Assistive device: Rolling walker (2 wheeled) Gait  Pattern/deviations: Step-through pattern;Decreased stride length;Decreased weight shift to left;Trunk flexed;Antalgic Gait velocity: decreased    General Gait Details: Min guard for safety, verbal cuing for stepping into RW and upright posture.   Stairs            Wheelchair Mobility    Modified Rankin (Stroke Patients Only)       Balance Overall balance assessment: Mild deficits observed, not formally tested                                           Pertinent Vitals/Pain Pain Assessment: 0-10 Pain Score: 3  Pain Descriptors / Indicators: Operative site guarding;Sore;Discomfort Pain Intervention(s): Limited activity within patient's tolerance;Ice applied;Monitored during session    Home Living Family/patient expects to be discharged to:: Private residence Living Arrangements: Parent(88 YO mother ) Available Help at Discharge: Family;Friend(s);Available PRN/intermittently(Pt states mother will assist her upon return home ) Type of Home: House Home Access: Level entry     Home Layout: One level Home Equipment: Walker - 2 wheels;Cane - single point;Toilet riser      Prior Function Level of Independence: Independent with assistive device(s)         Comments: used cane for ambulation during R TKR recovery      Hand Dominance        Extremity/Trunk Assessment   Upper Extremity Assessment Upper Extremity Assessment: Overall WFL for tasks assessed    Lower Extremity Assessment Lower Extremity Assessment: LLE deficits/detail RLE  Deficits / Details: knee flexion AAROM 5-80 degrees, suspected post-surgical RLE weakness. Weak quad set x5 LLE Sensation: WNL    Cervical / Trunk Assessment Cervical / Trunk Assessment: Normal  Communication   Communication: No difficulties  Cognition Arousal/Alertness: Awake/alert Behavior During Therapy: WFL for tasks assessed/performed Overall Cognitive Status: Within Functional Limits for tasks assessed                                         General Comments      Exercises Total Joint Exercises Ankle Circles/Pumps: AROM;Both;10 reps;Supine Quad Sets: AROM;Left;5 reps;Supine Gluteal Sets: 5 reps;Both;AROM;Supine Heel Slides: AAROM;Left;Supine;5 reps Goniometric ROM: approximately 5-80 degrees AAROM of knee    Assessment/Plan    PT Assessment Patient needs continued PT services  PT Problem List Decreased strength;Decreased range of motion;Decreased activity tolerance;Decreased mobility;Decreased knowledge of use of DME;Pain;Decreased balance       PT Treatment Interventions DME instruction;Therapeutic activities;Gait training;Therapeutic exercise;Patient/family education;Balance training;Functional mobility training    PT Goals (Current goals can be found in the Care Plan section)  Acute Rehab PT Goals PT Goal Formulation: With patient Time For Goal Achievement: 07/16/18 Potential to Achieve Goals: Good    Frequency 7X/week   Barriers to discharge        Co-evaluation               AM-PAC PT "6 Clicks" Daily Activity  Outcome Measure Difficulty turning over in bed (including adjusting bedclothes, sheets and blankets)?: A Little Difficulty moving from lying on back to sitting on the side of the bed? : A Little Difficulty sitting down on and standing up from a chair with arms (e.g., wheelchair, bedside commode, etc,.)?: A Little Help needed moving to and from a bed to chair (including a wheelchair)?: A Little Help needed walking in hospital room?: A Little Help needed climbing 3-5 steps with a railing? : A Lot 6 Click Score: 17    End of Session Equipment Utilized During Treatment: Gait belt;Oxygen(O2 reapplied after session ) Activity Tolerance: Patient tolerated treatment well Patient left: in chair;with SCD's reapplied;with call bell/phone within reach;with family/visitor present(no chair alarm on the unit, pt verbally agreed to call RN if needed  to mobilize, call bell in lap. ) Nurse Communication: Mobility status PT Visit Diagnosis: Other abnormalities of gait and mobility (R26.89);Difficulty in walking, not elsewhere classified (R26.2) Pain - Right/Left: Left Pain - part of body: Knee    Time: 6314-9702 PT Time Calculation (min) (ACUTE ONLY): 21 min   Charges:   PT Evaluation $PT Eval Low Complexity: 1 Low          Elza Sortor Terrial Rhodes, PT, DPT  Pager # [phone]    Rakayla Ricklefs D Romel Dumond 07/16/2018, 6:14 PM

## 2018-07-16 NOTE — Interval H&P Note (Signed)
History and Physical Interval Note:  07/16/2018 8:42 AM  Pamela Pena  has presented today for surgery, with the diagnosis of Left knee osteoarthritis  The various methods of treatment have been discussed with the patient and family. After consideration of risks, benefits and other options for treatment, the patient has consented to  Procedure(s) with comments: LEFT TOTAL KNEE ARTHROPLASTY (Left) - 70 mins as a surgical intervention .  The patient's history has been reviewed, patient examined, no change in status, stable for surgery.  I have reviewed the patient's chart and labs.  Questions were answered to the patient's satisfaction.     Shelda Pal

## 2018-07-16 NOTE — Anesthesia Procedure Notes (Signed)
Anesthesia Regional Block: Adductor canal block   Pre-Anesthetic Checklist: ,, timeout performed, Correct Patient, Correct Site, Correct Laterality, Correct Procedure, Correct Position, site marked, Risks and benefits discussed,  Surgical consent,  Pre-op evaluation,  At surgeon's request and post-op pain management  Laterality: Left  Prep: Maximum Sterile Barrier Precautions used, chloraprep       Needles:  Injection technique: Single-shot  Needle Type: Echogenic Needle     Needle Length: 9cm  Needle Gauge: 21     Additional Needles:   Procedures:,,,, ultrasound used (permanent image in chart),,,,  Narrative:  Start time: 07/16/2018 9:15 AM End time: 07/16/2018 9:23 AM Injection made incrementally with aspirations every 5 mL.  Performed by: Personally  Anesthesiologist: Trevor Iha, MD

## 2018-07-16 NOTE — Discharge Instructions (Signed)

## 2018-07-16 NOTE — Op Note (Signed)
NAME:  Pamela Pena                      MEDICAL RECORD NO.:  [id number]                             FACILITY:  [facility]      PHYSICIAN:  Madlyn Frankel. Charlann Boxer, M.D.  DATE OF BIRTH:  02-18-1951      DATE OF PROCEDURE:  07/16/2018                                     OPERATIVE REPORT         PREOPERATIVE DIAGNOSIS:  Left knee osteoarthritis.      POSTOPERATIVE DIAGNOSIS:  Left knee osteoarthritis.      FINDINGS:  The patient was noted to have complete loss of cartilage and   bone-on-bone arthritis with associated osteophytes in the medial and patellofemoral compartments of   the knee with a significant synovitis and associated effusion.  The patient had failed months of conservative treatment including medications, injection therapy, activity modification.     PROCEDURE:  Left total knee replacement.      COMPONENTS USED:  DePuy Attune rotating platform posterior stabilized knee   system, a size 4N femur, 4 tibia, size 7 mm PS AOX insert, and 35 anatomic patellar   button.      SURGEON:  Madlyn Frankel. Charlann Boxer, M.D.      ASSISTANT:  Lanney Gins, PA-C.      ANESTHESIA:  Regional and Spinal.      SPECIMENS:  None.      COMPLICATION:  None.      DRAINS:  None.  EBL: <100      TOURNIQUET TIME:   Total Tourniquet Time Documented: Thigh (Right) - 25 minutes Total: Thigh (Right) - 25 minutes  .      The patient was stable to the recovery room.      INDICATION FOR PROCEDURE:  Pamela Pena is a 67 y.o. female patient of   mine.  The patient had been seen, evaluated, and treated for months conservatively in the   office with medication, activity modification, and injections.  The patient had   radiographic changes of bone-on-bone arthritis with endplate sclerosis and osteophytes noted.  Based on the radiographic changes and failed conservative measures, the patient   decided to proceed with definitive treatment, total knee replacement.  Risks of infection, DVT, component failure, need for  revision surgery, neurovascular injury were reviewed in the office setting.  The postop course was reviewed stressing the efforts to maximize post-operative satisfaction and function.  Consent was obtained for benefit of pain   relief.      PROCEDURE IN DETAIL:  The patient was brought to the operative theater.   Once adequate anesthesia, preoperative antibiotics, 2 gm of Ancef,1 gm of Tranexamic Acid, and 10 mg of Decadron administered, the patient was positioned supine with a left thigh tourniquet placed.  The  left lower extremity was prepped and draped in sterile fashion.  A time-   out was performed identifying the patient, planned procedure, and the appropriate extremity.      The left lower extremity was placed in the [facility] leg holder.  The leg was   exsanguinated, tourniquet elevated to 250 mmHg.  A midline incision was   made  followed by median parapatellar arthrotomy.  Following initial   exposure, attention was first directed to the patella.  Precut   measurement was noted to be 21 mm.  I resected down to 13 mm and used a   35 anatomic patellar button to restore patellar height as well as cover the cut surface.      The lug holes were drilled and a metal shim was placed to protect the   patella from retractors and saw blade during the procedure.      At this point, attention was now directed to the femur.  The femoral   canal was opened with a drill, irrigated to try to prevent fat emboli.  An   intramedullary rod was passed at 3 degrees valgus, 9 mm of bone was   resected off the distal femur.  Following this resection, the tibia was   subluxated anteriorly.  Using the extramedullary guide, 2 mm of bone was resected off   the proximal medial tibia.  We confirmed the gap would be   stable medially and laterally with a size 5 spacer block as well as confirmed that the tibial cut was perpendicular in the coronal plane, checking with an alignment rod.      Once this was done, I  sized the femur to be a size 4 in the anterior-   posterior dimension, chose a narrow component based on medial and   lateral dimension.  The size 4 rotation block was then pinned in   position anterior referenced using the C-clamp to set rotation.  The   anterior, posterior, and  chamfer cuts were made without difficulty nor   notching making certain that I was along the anterior cortex to help   with flexion gap stability.      The final box cut was made off the lateral aspect of distal femur.      At this point, the tibia was sized to be a size 4.  The size 4 tray was   then pinned in position through the medial third of the tubercle,   drilled, and keel punched.  Trial reduction was now carried with a 4 femur,  4 tibia, a size 6 then 7 mm PS insert, and the 35 anatomic patella botton.  The knee was brought to full extension with good flexion stability with the patella   tracking through the trochlea without application of pressure.  Given   all these findings the trial components removed.  Final components were   opened and cement was mixed.  The knee was irrigated with normal saline solution and pulse lavage.  The synovial lining was   then injected with 30 cc of 0.25% Marcaine with epinephrine, 1 cc of Toradol and 30 cc of NS for a total of 61 cc.     Final implants were then cemented onto cleaned and dried cut surfaces of bone with the knee brought to extension with a size 7 mm PS trial insert.      Once the cement had fully cured, excess cement was removed   throughout the knee.  I confirmed that I was satisfied with the range of   motion and stability, and the final size 7 mm PS AOX insert was chosen.  It was   placed into the knee.      The tourniquet had been let down at 25 minutes.  No significant   hemostasis was required.  The extensor mechanism was then reapproximated using #1  Vicryl and #1 Stratafix sutures with the knee   in flexion.  The   remaining wound was closed  with 2-0 Vicryl and running 4-0 Monocryl.   The knee was cleaned, dried, dressed sterilely using Dermabond and   Aquacel dressing.  The patient was then   brought to recovery room in stable condition, tolerating the procedure   well.   Please note that Physician Assistant, Lanney Gins, PA-C was present for the entirety of the case, and was utilized for pre-operative positioning, peri-operative retractor management, general facilitation of the procedure and for primary wound closure at the end of the case.              Madlyn Frankel Charlann Boxer, M.D.    07/16/2018 11:07 AM

## 2018-07-16 NOTE — Anesthesia Postprocedure Evaluation (Signed)
Anesthesia Post Note  Patient: Pamela Pena  Procedure(s) Performed: LEFT TOTAL KNEE ARTHROPLASTY (Left Knee)     Patient location during evaluation: PACU Anesthesia Type: Regional and Spinal Level of consciousness: oriented and awake and alert Pain management: pain level controlled Vital Signs Assessment: post-procedure vital signs reviewed and stable Respiratory status: spontaneous breathing, respiratory function stable and patient connected to nasal cannula oxygen Cardiovascular status: blood pressure returned to baseline and stable Postop Assessment: no headache, no backache and no apparent nausea or vomiting Anesthetic complications: no    Last Vitals:  Vitals:   07/16/18 1256 07/16/18 1357  BP: 130/69 137/79  Pulse: 72 90  Resp: 14   Temp: 36.4 C (!) 36.4 C  SpO2: 98% 99%    Last Pain:  Vitals:   07/16/18 1357  TempSrc: Oral  PainSc:                  Trevor Iha

## 2018-07-17 ENCOUNTER — Encounter (HOSPITAL_COMMUNITY): Payer: Self-pay | Admitting: Orthopedic Surgery

## 2018-07-17 DIAGNOSIS — M1712 Unilateral primary osteoarthritis, left knee: Secondary | ICD-10-CM | POA: Diagnosis not present

## 2018-07-17 DIAGNOSIS — E663 Overweight: Secondary | ICD-10-CM | POA: Diagnosis present

## 2018-07-17 LAB — BASIC METABOLIC PANEL
ANION GAP: 6 (ref 5–15)
BUN: 15 mg/dL (ref 8–23)
CALCIUM: 8.9 mg/dL (ref 8.9–10.3)
CO2: 27 mmol/L (ref 22–32)
Chloride: 108 mmol/L (ref 98–111)
Creatinine, Ser: 0.61 mg/dL (ref 0.44–1.00)
GFR calc Af Amer: >60 mL/min (ref >60)
GLUCOSE: 126 mg/dL — AB (ref 70–99)
POTASSIUM: 4.5 mmol/L (ref 3.5–5.1)
SODIUM: 141 mmol/L (ref 135–145)

## 2018-07-17 LAB — CBC
HCT: 34 % — ABNORMAL LOW (ref 36.0–46.0)
Hemoglobin: 11.1 g/dL — ABNORMAL LOW (ref 12.0–15.0)
MCH: 34.3 pg — AB (ref 26.0–34.0)
MCHC: 32.6 g/dL (ref 30.0–36.0)
MCV: 104.9 fL — ABNORMAL HIGH (ref 78.0–100.0)
Platelets: 240 10*3/uL (ref 150–400)
RBC: 3.24 MIL/uL — AB (ref 3.87–5.11)
RDW: 13.7 % (ref 11.5–15.5)
WBC: 13.2 10*3/uL — AB (ref 4.0–10.5)

## 2018-07-17 MED ORDER — OXYCODONE HCL 5 MG PO TABS
5.0000 mg | ORAL_TABLET | Freq: Once | ORAL | Status: AC
  Administered 2018-07-17: 5 mg via ORAL
  Filled 2018-07-17: qty 1

## 2018-07-17 MED ORDER — OXYCODONE HCL 5 MG PO TABS: 5.0000 mg | ORAL_TABLET | ORAL | 0 refills | Status: DC | PRN

## 2018-07-17 MED ORDER — OXYCODONE HCL 5 MG PO TABS
5.0000 mg | ORAL_TABLET | ORAL | Status: DC | PRN
  Administered 2018-07-17: 10 mg via ORAL
  Filled 2018-07-17: qty 2

## 2018-07-17 NOTE — Progress Notes (Signed)
Physical Therapy Treatment Patient Details Name: Pamela Pena MRN: [id number] DOB: [birth year] Today's Date: 07/17/2018    History of Present Illness 67 YO female s/p L TKR on 07/16/18. PMH includes RA, piriformis syndrome, lumbar DDD, small fiber neuropathy. Past surgical history includes R TKR 05/2018.     PT Comments    Pt progressing well with mobility and therex program.  Pt to dc home today and follow up with OP PT starting Friday.   Follow Up Recommendations  Follow surgeon's recommendation for DC plan and follow-up therapies;Supervision for mobility/OOB     Equipment Recommendations  None recommended by PT    Recommendations for Other Services       Precautions / Restrictions Precautions Precautions: Knee;Fall Restrictions Weight Bearing Restrictions: No RLE Weight Bearing: Weight bearing as tolerated    Mobility  Bed Mobility Overal bed mobility: Needs Assistance Bed Mobility: Supine to Sit     Supine to sit: Min guard     General bed mobility comments: Verbal cuing for scooting to EOB, min guard for safety.   Transfers Overall transfer level: Needs assistance Equipment used: Rolling walker (2 wheeled) Transfers: Sit to/from Stand Sit to Stand: Min guard;Supervision         General transfer comment: cues for LE management and use of UEs to self assist  Ambulation/Gait Ambulation/Gait assistance: Min guard;Supervision Gait Distance (Feet): 200 Feet Assistive device: Rolling walker (2 wheeled) Gait Pattern/deviations: Step-through pattern;Decreased stride length;Decreased weight shift to left;Trunk flexed;Antalgic Gait velocity: decreased    General Gait Details: cues for posture, position from RW and initial sequence   Stairs             Wheelchair Mobility    Modified Rankin (Stroke Patients Only)       Balance Overall balance assessment: Mild deficits observed, not formally tested                                           Cognition Arousal/Alertness: Awake/alert Behavior During Therapy: WFL for tasks assessed/performed Overall Cognitive Status: Within Functional Limits for tasks assessed                                        Exercises Total Joint Exercises Ankle Circles/Pumps: AROM;Both;10 reps;Supine Quad Sets: AROM;Left;Supine;10 reps Heel Slides: AAROM;Supine;15 reps;Both Straight Leg Raises: Right;Supine;15 reps;AAROM;AROM Long Arc Quad: AROM;Right;10 reps Knee Flexion: AROM;Right;10 reps Goniometric ROM: R knee -8 - 111; L knee -1 -  103    General Comments        Pertinent Vitals/Pain Pain Assessment: 0-10 Pain Score: 4  Pain Location: right knee Pain Descriptors / Indicators: Operative site guarding;Sore;Discomfort Pain Intervention(s): Limited activity within patient's tolerance;Monitored during session;Premedicated before session;Ice applied    Home Living                      Prior Function            PT Goals (current goals can now be found in the care plan section) Acute Rehab PT Goals Patient Stated Goal: go home PT Goal Formulation: With patient Time For Goal Achievement: 07/16/18 Potential to Achieve Goals: Good Progress towards PT goals: Progressing toward goals    Frequency    7X/week      PT Plan Current  plan remains appropriate    Co-evaluation              AM-PAC PT "6 Clicks" Daily Activity  Outcome Measure  Difficulty turning over in bed (including adjusting bedclothes, sheets and blankets)?: A Little Difficulty moving from lying on back to sitting on the side of the bed? : A Little Difficulty sitting down on and standing up from a chair with arms (e.g., wheelchair, bedside commode, etc,.)?: A Little Help needed moving to and from a bed to chair (including a wheelchair)?: A Little Help needed walking in hospital room?: A Little Help needed climbing 3-5 steps with a railing? : A Little 6 Click Score: 18     End of Session Equipment Utilized During Treatment: Gait belt Activity Tolerance: Patient tolerated treatment well Patient left: in chair;with call bell/phone within reach;with family/visitor present Nurse Communication: Mobility status PT Visit Diagnosis: Difficulty in walking, not elsewhere classified (R26.2) Pain - Right/Left: Left Pain - part of body: Knee     Time: 1027-1109 PT Time Calculation (min) (ACUTE ONLY): 42 min  Charges:  $Gait Training: 8-22 mins $Therapeutic Exercise: 23-37 mins                     Pg [phone]    Garet Hooton 07/17/2018, 1:13 PM

## 2018-07-17 NOTE — Progress Notes (Signed)
     Subjective: 1 Day Post-Op Procedure(s) (LRB): LEFT TOTAL KNEE ARTHROPLASTY (Left)   Patient reports pain as moderate, not fully controlled with Norco.  She had her other knee replaced about a month ago, and wasn't fully off of Norco.  We have discussed analgesic meds and will change her to Oxycodone.  We have discussed only using as needed and stepping down when able.  She is not to take the Oxy and Norco at the same time. Patient is ready to be discharged home if she does well with PT and pain stays controlled.    Patient's anticipated LOS is less than 2 midnights, meeting these requirements: - Lives within 1 hour of care - Has a competent adult at home to recover with post-op recover - NO history of  - Chronic pain requiring opiods  - Diabetes  - Coronary Artery Disease  - Heart failure  - Heart attack  - Stroke  - DVT/VTE  - Cardiac arrhythmia  - Respiratory Failure/COPD  - Renal failure  - Anemia  - Advanced Liver disease       Objective:   VITALS:   Vitals:   07/17/18 0138 07/17/18 0642  BP: 115/73 (!) 104/55  Pulse: 66 (!) 55  Resp: 16 16  Temp: 98.3 F (36.8 C) 98.5 F (36.9 C)  SpO2: 96% 95%    Dorsiflexion/Plantar flexion intact Incision: dressing C/D/I No cellulitis present Compartment soft  LABS Recent Labs    07/17/18 0525  HGB 11.1*  HCT 34.0*  WBC 13.2*  PLT 240    Recent Labs    07/17/18 0525  NA 141  K 4.5  BUN 15  CREATININE 0.61  GLUCOSE 126*     Assessment/Plan: 1 Day Post-Op Procedure(s) (LRB): LEFT TOTAL KNEE ARTHROPLASTY (Left) Foley cath d/c'ed Advance diet Up with therapy D/C IV fluids Discharge home Follow up in 2 weeks at [facility]). Follow up with OLIN,Xee Hollman D in 2 weeks.  Contact information:  [facility]) [address] [place] [address] [phone]    Overweight (BMI 25-29.9) Estimated body mass index is  26.29 kg/m as calculated from the following:   Height as of this encounter: 5\' 5"  (1.651 m).   Weight as of this encounter: 71.7 kg. Patient also counseled that weight may inhibit the healing process Patient counseled that losing weight will help with future health issues      . Apostolos Blagg   PAC  07/17/2018, 9:08 AM

## 2018-07-22 NOTE — Discharge Summary (Signed)
Physician Discharge Summary  Patient ID: Pamela Pena MRN: [id number] DOB/AGE: 1951/01/28 67 y.o.  Admit date: 07/16/2018 Discharge date: 07/17/2018   Procedures:  Procedure(s) (LRB): LEFT TOTAL KNEE ARTHROPLASTY (Left)  Attending Physician:  Dr. Durene Romans   Admission Diagnoses:   Left knee primary OA / pain  Discharge Diagnoses:  Principal Problem:   S/P left TKA Active Problems:   Overweight (BMI 25.0-29.9)  Past Medical History:  Diagnosis Date  . Disc degeneration, lumbar   . Piriformis syndrome   . RA (rheumatoid arthritis) (HCC)     HPI:    Pamela Pena, 67 y.o. female, has a history of pain and functional disability in the left knee due to arthritis and has failed non-surgical conservative treatments for greater than 12 weeks to include NSAID's and/or analgesics, corticosteriod injections and activity modification.  Onset of symptoms was gradual, starting 3 years ago with gradually worsening course since that time. The patient noted prior procedures on the knee to include  arthroplasty on the right knee(s).  Patient currently rates pain in the left knee(s) at 7 out of 10 with activity. Patient has night pain, worsening of pain with activity and weight bearing, pain that interferes with activities of daily living, pain with passive range of motion, crepitus and joint swelling.  Patient has evidence of periarticular osteophytes and joint space narrowing by imaging studies.  There is no active infection.  Risks, benefits and expectations were discussed with the patient.  Risks including but not limited to the risk of anesthesia, blood clots, nerve damage, blood vessel damage, failure of the prosthesis, infection and up to and including death.  Patient understand the risks, benefits and expectations and wishes to proceed with surgery.  PCP: Merri Brunette, MD   Discharged Condition: good  Hospital Course:  Patient underwent the above stated procedure on 07/16/2018. Patient  tolerated the procedure well and brought to the recovery room in good condition and subsequently to the floor.  POD #1 BP: 104/55 ; Pulse: 55 ; Temp: 98.5 F (36.9 C) ; Resp: 16 Patient reports pain as moderate, not fully controlled with Norco.  She had her other knee replaced about a month ago, and wasn't fully off of Norco.  We have discussed analgesic meds and will change her to Oxycodone.  We have discussed only using as needed and stepping down when able.  She is not to take the Oxy and Norco at the same time. Patient is ready to be discharged home. Dorsiflexion/plantar flexion intact, incision: dressing C/D/I, no cellulitis present and compartment soft.   LABS  Basename    HGB     11.1  HCT     34.0    Discharge Exam: General appearance: alert, cooperative and no distress Extremities: Homans sign is negative, no sign of DVT, no edema, redness or tenderness in the calves or thighs and no ulcers, gangrene or trophic changes  Disposition:  Home with follow up in 2 weeks   Follow-up Information    Durene Romans, MD. Schedule an appointment as soon as possible for a visit in 2 weeks.   Specialty:  Orthopedic Surgery Contact information: [address] [place] [address] [phone]           Discharge Instructions    Call MD / Call 911   Complete by:  As directed    If you experience chest pain or shortness of breath, CALL 911 and be transported to the hospital emergency room.  If you develope a fever above 101 F, pus (white drainage) or increased drainage or redness at the wound, or calf pain, call your surgeon's office.   Change dressing   Complete by:  As directed    Maintain surgical dressing until follow up in the clinic. If the edges start to pull up, may reinforce with tape. If the dressing is no longer working, may remove and cover with gauze and tape, but must keep the area dry and clean.  Call with any questions or concerns.   Constipation  Prevention   Complete by:  As directed    Drink plenty of fluids.  Prune juice may be helpful.  You may use a stool softener, such as Colace (over the counter) 100 mg twice a day.  Use MiraLax (over the counter) for constipation as needed.   Diet - low sodium heart healthy   Complete by:  As directed    Discharge instructions   Complete by:  As directed    Maintain surgical dressing until follow up in the clinic. If the edges start to pull up, may reinforce with tape. If the dressing is no longer working, may remove and cover with gauze and tape, but must keep the area dry and clean.  Follow up in 2 weeks at [facility]. Call with any questions or concerns.   Increase activity slowly as tolerated   Complete by:  As directed    Weight bearing as tolerated with assist device (walker, cane, etc) as directed, use it as long as suggested by your surgeon or therapist, typically at least 4-6 weeks.   TED hose   Complete by:  As directed    Use stockings (TED hose) for 2 weeks on both leg(s).  You may remove them at night for sleeping.      Allergies as of 07/17/2018   No Known Allergies     Medication List    STOP taking these medications   HYDROcodone-acetaminophen 7.5-325 MG tablet Commonly known as:  NORCO     TAKE these medications   aspirin 81 MG chewable tablet Chew 1 tablet (81 mg total) by mouth 2 (two) times daily. Take for 4 weeks, then resume regular dose.   BIOTIN 5000 5 MG Caps Generic drug:  Biotin Take 5 mg by mouth daily.   CALCIUM 600 PO Take 600 mg by mouth 2 (two) times daily.   celecoxib 200 MG capsule Commonly known as:  CELEBREX Take 1 capsule (200 mg total) by mouth 2 (two) times daily.   cyanocobalamin 1000 MCG tablet Take 1,000 mcg by mouth 2 (two) times daily.   docusate sodium 100 MG capsule Commonly known as:  COLACE Take 1 capsule (100 mg total) by mouth 2 (two) times daily. What changed:    when to take this  reasons to take  this   ferrous sulfate 325 (65 FE) MG tablet Take 1 tablet (325 mg total) by mouth 3 (three) times daily with meals.   Fish Oil 1000 MG Caps Take 1,000 mg by mouth 2 (two) times daily.   folic acid 400 MCG tablet Commonly known as:  FOLVITE Take 400 mcg by mouth daily.   LUTEIN-ZEAXANTHIN-BILBERRY PO Take 1 capsule by mouth daily. HERBAVISION   Magnesium 500 MG Caps Take 500 mg by mouth every evening.   Melatonin 3 MG Caps Take 3 mg by mouth at bedtime.   methocarbamol 500 MG tablet Commonly known as:  ROBAXIN Take 1 tablet (500 mg total)  by mouth every 6 (six) hours as needed for muscle spasms.   methotrexate 2.5 MG tablet Commonly known as:  RHEUMATREX Take 1 tablet (2.5 mg total) by mouth 2 (two) times a week. Caution:Chemotherapy. Protect from light.  Takes 6 tablets per week , 3 tablets in the morning and 3 at HS   HOLD FOR 2 WEEKS AFTER SURGERY. What changed:  additional instructions   oxyCODONE 5 MG immediate release tablet Commonly known as:  Oxy IR/ROXICODONE Take 1-2 tablets (5-10 mg total) by mouth every 4 (four) hours as needed for moderate pain or severe pain.   polyethylene glycol packet Commonly known as:  MIRALAX / GLYCOLAX Take 17 g by mouth 2 (two) times daily. What changed:  when to take this   predniSONE 5 MG tablet Commonly known as:  DELTASONE Take 5 mg by mouth daily.   promethazine 12.5 MG tablet Commonly known as:  PHENERGAN Take 1 tablet (12.5 mg total) by mouth every 6 (six) hours as needed for nausea or vomiting.   TURMERIC PO Take 1,800 mg by mouth 2 (two) times daily.   vitamin C 1000 MG tablet Take 1,000 mg by mouth 2 (two) times daily.   Vitamin D3 5000 units Caps Take 5,000 Units by mouth daily.   vitamin E 400 UNIT capsule Take 400 Units by mouth daily.   zolpidem 5 MG tablet Commonly known as:  AMBIEN Take 5 mg by mouth at bedtime.            Discharge Care Instructions  (From admission, onward)          Start     Ordered   07/17/18 0000  Change dressing    Comments:  Maintain surgical dressing until follow up in the clinic. If the edges start to pull up, may reinforce with tape. If the dressing is no longer working, may remove and cover with gauze and tape, but must keep the area dry and clean.  Call with any questions or concerns.   07/17/18 0915           Signed: Anastasio Auerbach. Mariela Rex   PA-C  07/22/2018, 12:37 PM

## 2018-08-27 ENCOUNTER — Ambulatory Visit: Payer: 59 | Admitting: Nurse Practitioner

## 2018-09-13 ENCOUNTER — Encounter (INDEPENDENT_AMBULATORY_CARE_PROVIDER_SITE_OTHER): Payer: 59 | Admitting: Ophthalmology

## 2018-09-13 DIAGNOSIS — H33303 Unspecified retinal break, bilateral: Secondary | ICD-10-CM | POA: Diagnosis not present

## 2018-09-13 DIAGNOSIS — H35371 Puckering of macula, right eye: Secondary | ICD-10-CM | POA: Diagnosis not present

## 2018-09-13 DIAGNOSIS — H43813 Vitreous degeneration, bilateral: Secondary | ICD-10-CM

## 2018-09-16 DIAGNOSIS — H35371 Puckering of macula, right eye: Secondary | ICD-10-CM | POA: Diagnosis present

## 2018-09-16 NOTE — H&P (Signed)
Pamela Pena is an 67 y.o. female.   Chief Complaint: gradual loss of vision right eye HPI: Multiple retinal breaks in the past.  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 Disc degeneration, lumbar   . Piriformis syndrome   . RA (rheumatoid arthritis) (HCC)     Past Surgical History:  Procedure Laterality Date  . CESAREAN SECTION  1970  . TONSILLECTOMY     age 46  . 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 TOTAL KNEE ARTHROPLASTY Left 07/16/2018   Procedure: LEFT TOTAL KNEE ARTHROPLASTY;  Surgeon: Durene Romans, MD;  Location: [facility];  Service: Orthopedics;  Laterality: Left;  Adductor Block    Family History  Problem Relation Age of Onset  . Breast cancer Maternal Aunt   . CAD Mother   . Cancer Father   . Neuropathy Neg Hx    Social History:  reports that she quit smoking about 45 years ago. She has never used smokeless tobacco. She reports that she drank alcohol. She reports that she has current or past drug history.  Allergies: No Known Allergies  No medications prior to admission.    Review of systems otherwise negative  There were no vitals taken for this visit.  Physical exam: Mental status: oriented x3. Eyes: See eye exam associated with this date of surgery in media tab.  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

## 2018-09-19 NOTE — Progress Notes (Signed)
[facility]  PATIENT: Pamela Pena DOB: 1951-10-25   REASON FOR VISIT: Follow-up for neuropathy  HISTORY FROM:patient    HISTORYOF PRESENT ILLNESS: Update 09/20/18 [name], 67 year old female returns for follow-up with bilateral foot numbness.  Her labs that were drawn at her last visit were all within normal limits except for a toxic level of B6.  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  She is not particularly interested in being on any nerve pain medication at this time.  She denies any falls she denies any balance issues. She also has RA and has been on methotrexate for years.  She returns for reevaluation  4/1/19AASusan C Pena is a 67 y.o. female here as a referral from Dr. Renne Crigler and Dr. Ethelene Hal for bilateral foot numbness. PMHx degeneration of lumbar disc, piriformis syndrome, sacroiliac joint pain, numbness of foot, pain in right and left knee, rheumatoid arthritis. She has been having sciatic problems for a long time, after she started seeing Dr. Ethelene Hal she noticed the tips of her toes were numb noticed it in bed, didn't think anything of it started in January of this year. Started slowly progressing to the rest of the foot. Now all of her toes are numb and into the ball of the feet, symmetric. No pain just numb. Continous, nothing makes it better or worse. No burning, shooting, tingling. She sees Dr. Dierdre Forth for her RA and is on methotrexate weekly and daily prednisone. She has been on methotrexate for 15 years. No long-term antibiotics, new medication, exposure to toxins. No family history of neuropathy. REVIEW OF SYSTEMS: Full 14 system review of systems performed and notable only for those listed, all others are neg:  Constitutional: neg  Cardiovascular: neg Ear/Nose/Throat: neg  Skin: neg Eyes: neg Respiratory: neg Gastroitestinal: neg  Hematology/Lymphatic: neg  Endocrine:  neg Musculoskeletal:neg Allergy/Immunology: neg Neurological: Numbness in the feet Psychiatric: neg Sleep : neg   ALLERGIES: No Known Allergies  HOME MEDICATIONS: Outpatient Medications Prior to Visit  Medication Sig Dispense Refill  . Ascorbic Acid (VITAMIN C) 1000 MG tablet Take 1,000 mg by mouth 2 (two) times daily.    . Biotin (BIOTIN 5000) 5 MG CAPS Take 5 mg by mouth daily.     . Calcium Carbonate (CALCIUM 600 PO) Take 600 mg by mouth 2 (two) times daily.    . Cholecalciferol (VITAMIN D3) 5000 units CAPS Take 5,000 Units by mouth daily.    . cyanocobalamin 1000 MCG tablet Take 1,000 mcg by mouth 2 (two) times daily.    . folic acid (FOLVITE) 400 MCG tablet Take 400 mcg by mouth daily.    Marland Kitchen HYDROcodone-acetaminophen (NORCO/VICODIN) 5-325 MG tablet Take 1 tablet by mouth every 8 (eight) hours.    . LUTEIN-ZEAXANTHIN-BILBERRY PO Take 1 capsule by mouth daily. HERBAVISION    . Magnesium 500 MG CAPS Take 500 mg by mouth every evening.     . Melatonin 3 MG CAPS Take 3 mg by mouth at bedtime.    . methocarbamol (ROBAXIN) 500 MG tablet Take 500 mg by mouth every 8 (eight) hours.    . methotrexate (RHEUMATREX) 2.5 MG tablet Take 1 tablet (2.5 mg total) by mouth 2 (two) times a week. Caution:Chemotherapy. Protect from light.  Takes 6 tablets per week , 3 tablets in the morning and 3 at HS   HOLD FOR 2 WEEKS AFTER SURGERY. 4 tablet 0  . naproxen (NAPROSYN) 500 MG tablet Take  500 mg by mouth 2 (two) times daily with a meal.    . Omega-3 Fatty Acids (FISH OIL) 1000 MG CAPS Take 1,000 mg by mouth 2 (two) times daily.     Marland Kitchen oxyCODONE (OXY IR/ROXICODONE) 5 MG immediate release tablet Take 1-2 tablets (5-10 mg total) by mouth every 4 (four) hours as needed for moderate pain or severe pain. 60 tablet 0  . polyethylene glycol (MIRALAX / GLYCOLAX) packet Take 17 g by mouth 2 (two) times daily. (Patient taking differently: Take 17 g by mouth at bedtime. ) 14 each 0  . predniSONE (DELTASONE) 5 MG  tablet Take 5 mg by mouth daily.     . promethazine (PHENERGAN) 12.5 MG tablet Take 1 tablet (12.5 mg total) by mouth every 6 (six) hours as needed for nausea or vomiting. 30 tablet 0  . TURMERIC PO Take 1,800 mg by mouth 2 (two) times daily.    . vitamin E 400 UNIT capsule Take 400 Units by mouth daily.    Marland Kitchen zolpidem (AMBIEN) 5 MG tablet Take 5 mg by mouth at bedtime.     . celecoxib (CELEBREX) 200 MG capsule Take 1 capsule (200 mg total) by mouth 2 (two) times daily. (Patient not taking: Reported on 09/20/2018) 60 capsule 0  . docusate sodium (COLACE) 100 MG capsule Take 1 capsule (100 mg total) by mouth 2 (two) times daily. (Patient not taking: Reported on 09/20/2018) 10 capsule 0  . ferrous sulfate (FERROUSUL) 325 (65 FE) MG tablet Take 1 tablet (325 mg total) by mouth 3 (three) times daily with meals. (Patient not taking: Reported on 09/20/2018)  3  . methocarbamol (ROBAXIN) 500 MG tablet Take 1 tablet (500 mg total) by mouth every 6 (six) hours as needed for muscle spasms. (Patient not taking: Reported on 07/03/2018) 40 tablet 0   No facility-administered medications prior to visit.     PAST MEDICAL HISTORY: Past Medical History:  Diagnosis Date  . Disc degeneration, lumbar   . Piriformis syndrome   . RA (rheumatoid arthritis) (HCC)     PAST SURGICAL HISTORY: Past Surgical History:  Procedure Laterality Date  . CESAREAN SECTION  1970  . TONSILLECTOMY     age 87  . 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 TOTAL KNEE ARTHROPLASTY Left 07/16/2018   Procedure: LEFT TOTAL KNEE ARTHROPLASTY;  Surgeon: Durene Romans, MD;  Location: [facility];  Service: Orthopedics;  Laterality: Left;  Adductor Block    FAMILY HISTORY: Family History  Problem Relation Age of Onset  . Breast cancer Maternal Aunt   . CAD Mother   . Cancer Father   . Neuropathy Neg Hx      SOCIAL HISTORY: Social History   Socioeconomic History  . Marital status: Widowed    Spouse name: Not on file  . Number of children: 2  . Years of education: 23  . Highest education level: Not on file  Occupational History  . Not on file  Social Needs  . Financial resource strain: Not on file  . Food insecurity:    Worry: Not on file    Inability: Not on file  . Transportation needs:    Medical: Not on file    Non-medical: Not on file  Tobacco Use  . Smoking status: Former Smoker    Last attempt to quit: 1974    Years  since quitting: 45.8  . Smokeless tobacco: Never Used  Substance and Sexual Activity  . Alcohol use: Not Currently    Comment: quit 1974  . Drug use: Not Currently    Comment: some use for 5 years after high school.   . Sexual activity: Not on file  Lifestyle  . Physical activity:    Days per week: Not on file    Minutes per session: Not on file  . Stress: Not on file  Relationships  . Social connections:    Talks on phone: Not on file    Gets together: Not on file    Attends religious service: Not on file    Active member of club or organization: Not on file    Attends meetings of clubs or organizations: Not on file    Relationship status: Not on file  . Intimate partner violence:    Fear of current or ex partner: Not on file    Emotionally abused: Not on file    Physically abused: Not on file    Forced sexual activity: Not on file  Other Topics Concern  . Not on file  Social History Narrative   Lives at home. Her mother lives with her and she is her caretaker   Right handed   Drinks 2-4 cups of caffeine daily     PHYSICAL EXAM  Vitals:   09/20/18 1009  BP: (!) 150/80  Pulse: 79  Weight: 156 lb 3.2 oz (70.9 kg)   Body mass index is 25.99 kg/m.  Generalized: Well developed, in no acute distress  Head: normocephalic and atraumatic,. Oropharynx benign  Neck: Supple, Musculoskeletal: No deformity   Neurological examination    Mentation: Alert oriented to time, place, history taking. Attention span and concentration appropriate. Recent and remote memory intact.  Follows all commands speech and language fluent.   Cranial nerve II-XII: Pupils were equal round reactive to light extraocular movements were full, visual field were full on confrontational test. Facial sensation and strength were normal. hearing was intact to finger rubbing bilaterally. Uvula tongue midline. head turning and shoulder shrug were normal and symmetric.Tongue protrusion into cheek strength was normal. Motor: normal bulk and tone, full strength in the BUE, BLE, Sensory: normal and symmetric to light touch, decreased pinprick in the feet, intact  Vibration, proprioception  Coordination: finger-nose-finger, heel-to-shin bilaterally, no dysmetria Reflexes: Brisk upper and lower including ankle jerks, plantar responses were flexor on the right downgoing on the left  Gait and Station: Rising up from seated position without assistance, normal stance,  moderate stride, good arm swing, smooth turning, able to perform tiptoe, and heel walking without difficulty. Tandem gait is steady  DIAGNOSTIC DATA (LABS, IMAGING, TESTING) - I reviewed patient records, labs, notes, testing and imaging myself where available.  Lab Results  Component Value Date   WBC 13.2 (H) 07/17/2018   HGB 11.1 (L) 07/17/2018   HCT 34.0 (L) 07/17/2018   MCV 104.9 (H) 07/17/2018   PLT 240 07/17/2018      Component Value Date/Time   NA 141 07/17/2018 0525   NA 145 (H) 02/25/2018 1119   K 4.5 07/17/2018 0525   CL 108 07/17/2018 0525   CO2 27 07/17/2018 0525   GLUCOSE 126 (H) 07/17/2018 0525   BUN 15 07/17/2018 0525   BUN 17 02/25/2018 1119   CREATININE 0.61 07/17/2018 0525   CALCIUM 8.9 07/17/2018 0525   PROT 6.4 02/25/2018 1119   ALBUMIN 4.6 02/25/2018 1119   AST 28 02/25/2018  1119   ALT 24 02/25/2018 1119   ALKPHOS 94 02/25/2018 1119   BILITOT 0.3 02/25/2018 1119    GFRNONAA >60 07/17/2018 0525   GFRAA >60 07/17/2018 0525    Lab Results  Component Value Date   HGBA1C 5.2 02/25/2018   Lab Results  Component Value Date   VITAMINB12 1,271 (H) 02/25/2018   Lab Results  Component Value Date   TSH 0.369 (L) 02/25/2018      ASSESSMENT AND PLAN 66 y.o. female here as a referral from Dr. Renne Crigler and Dr. Ethelene Hal for bilateral foot numbness likely small-fiber. PMHx degeneration of lumbar disc, piriformis syndrome, sacroiliac joint pain, numbness of foot, pain in right and left knee, rheumatoid arthritis. Risk factors include autoimmune disease and long-term use of methotrexate but she is compliant with vitamin B supplementation.   PLAN: Labs for neuropathy all returned normal except elevated B6 Patient continues to complain of numbness, will follow over time Follow-up in 6 to 8 months with Dr. Haynes Bast, [facility], [facility], APRN  [facility] [address], [address] [place], [place] [address] [phone]

## 2018-09-20 ENCOUNTER — Encounter: Payer: Self-pay | Admitting: Nurse Practitioner

## 2018-09-20 ENCOUNTER — Ambulatory Visit (INDEPENDENT_AMBULATORY_CARE_PROVIDER_SITE_OTHER): Payer: 59 | Admitting: Nurse Practitioner

## 2018-09-20 VITALS — BP 150/80 | HR 79 | Wt 156.2 lb

## 2018-09-20 DIAGNOSIS — G629 Polyneuropathy, unspecified: Secondary | ICD-10-CM

## 2018-09-20 NOTE — Patient Instructions (Signed)
Labs for neuropathy all returned normal except elevated B6 Patient continues to complain of numbness, will follow over time Follow-up in 6 to 8 months with Dr. Lucia Gaskins

## 2018-09-24 NOTE — Progress Notes (Signed)
Agree with history, physical, neuro exam,assessment and plan as stated above.     Ronelle Smallman, MD [facility]    

## 2018-09-27 ENCOUNTER — Other Ambulatory Visit: Payer: Self-pay

## 2018-09-27 ENCOUNTER — Encounter (HOSPITAL_COMMUNITY): Payer: Self-pay | Admitting: *Deleted

## 2018-09-27 NOTE — Progress Notes (Signed)
Pamela Pena denies chest pain or shortness of breath. Patient states tahtshe was instructed to arrive to the hospital ar 0900 on Tuesday.  I Instructed patient to hold Vitamins, Naproxen and herbal products, beginning today.

## 2018-10-01 ENCOUNTER — Encounter (HOSPITAL_COMMUNITY): Payer: Self-pay

## 2018-10-01 ENCOUNTER — Ambulatory Visit (HOSPITAL_COMMUNITY): Payer: 59 | Admitting: Anesthesiology

## 2018-10-01 ENCOUNTER — Encounter (HOSPITAL_COMMUNITY)
Admission: RE | Disposition: A | Discharge status: Home / Self Care | Payer: Self-pay | Source: Ambulatory Visit | Attending: Ophthalmology

## 2018-10-01 ENCOUNTER — Ambulatory Visit (HOSPITAL_COMMUNITY)
Admission: RE | Admit: 2018-10-01 | Discharge: 2018-10-02 | Disposition: A | Discharge status: Home / Self Care | Payer: 59 | Source: Ambulatory Visit | Attending: Ophthalmology | Admitting: Ophthalmology

## 2018-10-01 DIAGNOSIS — H35341 Macular cyst, hole, or pseudohole, right eye: Secondary | ICD-10-CM | POA: Diagnosis present

## 2018-10-01 DIAGNOSIS — Z87891 Personal history of nicotine dependence: Secondary | ICD-10-CM | POA: Insufficient documentation

## 2018-10-01 DIAGNOSIS — H5789 Other specified disorders of eye and adnexa: Secondary | ICD-10-CM | POA: Diagnosis not present

## 2018-10-01 DIAGNOSIS — Z96653 Presence of artificial knee joint, bilateral: Secondary | ICD-10-CM | POA: Insufficient documentation

## 2018-10-01 DIAGNOSIS — H35371 Puckering of macula, right eye: Secondary | ICD-10-CM

## 2018-10-01 HISTORY — PX: LASER PHOTO ABLATION: SHX5942

## 2018-10-01 HISTORY — PX: 25 GAUGE PARS PLANA VITRECTOMY WITH 20 GAUGE MVR PORT FOR MACULAR HOLE: SHX6096

## 2018-10-01 HISTORY — PX: MEMBRANE PEEL: SHX5967

## 2018-10-01 HISTORY — PX: AIR/FLUID EXCHANGE: SHX6494

## 2018-10-01 LAB — HEMOGLOBIN: Hemoglobin: 13.9 g/dL (ref 12.0–15.0)

## 2018-10-01 LAB — AUTOLOGOUS SERUM PATCH PREP

## 2018-10-01 SURGERY — 25 GAUGE PARS PLANA VITRECTOMY WITH 20 GAUGE MVR PORT FOR MACULAR HOLE
Anesthesia: General | Site: Eye | Laterality: Right

## 2018-10-01 MED ORDER — ATROPINE SULFATE 1 % OP SOLN
OPHTHALMIC | Status: AC
  Filled 2018-10-01: qty 5

## 2018-10-01 MED ORDER — FENTANYL CITRATE (PF) 250 MCG/5ML IJ SOLN
INTRAMUSCULAR | Status: DC | PRN
  Administered 2018-10-01: 100 ug via INTRAVENOUS

## 2018-10-01 MED ORDER — 0.9 % SODIUM CHLORIDE (POUR BTL) OPTIME
TOPICAL | Status: DC | PRN
  Administered 2018-10-01: 200 mL

## 2018-10-01 MED ORDER — BSS IO SOLN
INTRAOCULAR | Status: AC
  Filled 2018-10-01: qty 15

## 2018-10-01 MED ORDER — STERILE WATER FOR INJECTION IJ SOLN
INTRAMUSCULAR | Status: AC
  Filled 2018-10-01: qty 20

## 2018-10-01 MED ORDER — SODIUM CHLORIDE 0.9 % IV SOLN
INTRAVENOUS | Status: DC | PRN
  Administered 2018-10-01: 30 ug/min via INTRAVENOUS

## 2018-10-01 MED ORDER — HYDROCODONE-ACETAMINOPHEN 5-325 MG PO TABS
1.0000 | ORAL_TABLET | Freq: Four times a day (QID) | ORAL | Status: DC | PRN
  Administered 2018-10-01: 1 via ORAL

## 2018-10-01 MED ORDER — ONDANSETRON HCL 4 MG/2ML IJ SOLN
INTRAMUSCULAR | Status: DC | PRN
  Administered 2018-10-01: 4 mg via INTRAVENOUS

## 2018-10-01 MED ORDER — ONDANSETRON HCL 4 MG/2ML IJ SOLN: 4.0000 mg | Freq: Four times a day (QID) | INTRAMUSCULAR | Status: DC | PRN

## 2018-10-01 MED ORDER — POLYETHYLENE GLYCOL 3350 17 G PO PACK
17.0000 g | PACK | Freq: Every day | ORAL | Status: DC
  Administered 2018-10-01: 17 g via ORAL
  Filled 2018-10-01: qty 1

## 2018-10-01 MED ORDER — DEXAMETHASONE SODIUM PHOSPHATE 10 MG/ML IJ SOLN
INTRAMUSCULAR | Status: DC | PRN
  Administered 2018-10-01: 10 mg

## 2018-10-01 MED ORDER — ZOLPIDEM TARTRATE 5 MG PO TABS
5.0000 mg | ORAL_TABLET | Freq: Every day | ORAL | Status: DC
  Administered 2018-10-01: 5 mg via ORAL
  Filled 2018-10-01: qty 1

## 2018-10-01 MED ORDER — TURMERIC 500 MG PO CAPS: 1800.0000 mg | ORAL_CAPSULE | Freq: Two times a day (BID) | ORAL | Status: DC

## 2018-10-01 MED ORDER — TROPICAMIDE 1 % OP SOLN
1.0000 [drp] | OPHTHALMIC | Status: AC | PRN
  Administered 2018-10-01 (×3): 1 [drp] via OPHTHALMIC
  Filled 2018-10-01: qty 15

## 2018-10-01 MED ORDER — MAGNESIUM HYDROXIDE 400 MG/5ML PO SUSP: 15.0000 mL | Freq: Four times a day (QID) | ORAL | Status: DC | PRN

## 2018-10-01 MED ORDER — SODIUM CHLORIDE 0.9 % IV SOLN
INTRAVENOUS | Status: DC
  Administered 2018-10-01: 10:00:00 via INTRAVENOUS

## 2018-10-01 MED ORDER — TETRACAINE HCL 0.5 % OP SOLN
2.0000 [drp] | Freq: Once | OPHTHALMIC | Status: DC
  Filled 2018-10-01: qty 4

## 2018-10-01 MED ORDER — SODIUM CHLORIDE 0.45 % IV SOLN
INTRAVENOUS | Status: DC
  Administered 2018-10-01: 17:00:00 via INTRAVENOUS

## 2018-10-01 MED ORDER — FENTANYL CITRATE (PF) 100 MCG/2ML IJ SOLN: 25.0000 ug | INTRAMUSCULAR | Status: DC | PRN

## 2018-10-01 MED ORDER — GATIFLOXACIN 0.5 % OP SOLN
1.0000 [drp] | Freq: Four times a day (QID) | OPHTHALMIC | Status: DC
  Filled 2018-10-01: qty 2.5

## 2018-10-01 MED ORDER — MORPHINE SULFATE (PF) 2 MG/ML IV SOLN: 1.0000 mg | INTRAVENOUS | Status: DC | PRN

## 2018-10-01 MED ORDER — ACETAZOLAMIDE SODIUM 500 MG IJ SOLR
500.0000 mg | Freq: Once | INTRAMUSCULAR | Status: AC
  Administered 2018-10-02: 500 mg via INTRAVENOUS
  Filled 2018-10-01: qty 500

## 2018-10-01 MED ORDER — BACITRACIN-POLYMYXIN B 500-10000 UNIT/GM OP OINT
1.0000 "application " | TOPICAL_OINTMENT | Freq: Three times a day (TID) | OPHTHALMIC | Status: DC
  Filled 2018-10-01: qty 3.5

## 2018-10-01 MED ORDER — SCOPOLAMINE 1 MG/3DAYS TD PT72
1.0000 | MEDICATED_PATCH | TRANSDERMAL | Status: DC
  Administered 2018-10-01: 1.5 mg via TRANSDERMAL
  Filled 2018-10-01: qty 1

## 2018-10-01 MED ORDER — BUPIVACAINE HCL (PF) 0.75 % IJ SOLN
INTRAMUSCULAR | Status: DC | PRN
  Administered 2018-10-01: 10 mL

## 2018-10-01 MED ORDER — NAPROXEN 250 MG PO TABS
500.0000 mg | ORAL_TABLET | Freq: Two times a day (BID) | ORAL | Status: DC
  Administered 2018-10-01: 500 mg via ORAL
  Filled 2018-10-01: qty 2

## 2018-10-01 MED ORDER — EPINEPHRINE PF 1 MG/ML IJ SOLN
INTRAMUSCULAR | Status: AC
  Filled 2018-10-01: qty 1

## 2018-10-01 MED ORDER — PREDNISOLONE ACETATE 1 % OP SUSP
1.0000 [drp] | Freq: Four times a day (QID) | OPHTHALMIC | Status: DC
  Filled 2018-10-01: qty 5

## 2018-10-01 MED ORDER — STERILE WATER FOR IRRIGATION IR SOLN
Status: DC | PRN
  Administered 2018-10-01: 100 mL

## 2018-10-01 MED ORDER — LATANOPROST 0.005 % OP SOLN
1.0000 [drp] | Freq: Every day | OPHTHALMIC | Status: DC
  Filled 2018-10-01: qty 2.5

## 2018-10-01 MED ORDER — SUGAMMADEX SODIUM 200 MG/2ML IV SOLN
INTRAVENOUS | Status: DC | PRN
  Administered 2018-10-01: 200 mg via INTRAVENOUS

## 2018-10-01 MED ORDER — CEFAZOLIN SODIUM-DEXTROSE 2-4 GM/100ML-% IV SOLN
2.0000 g | INTRAVENOUS | Status: AC
  Administered 2018-10-01: 2 g via INTRAVENOUS
  Filled 2018-10-01: qty 100

## 2018-10-01 MED ORDER — ROCURONIUM BROMIDE 10 MG/ML (PF) SYRINGE
PREFILLED_SYRINGE | INTRAVENOUS | Status: DC | PRN
  Administered 2018-10-01 (×2): 10 mg via INTRAVENOUS
  Administered 2018-10-01: 50 mg via INTRAVENOUS

## 2018-10-01 MED ORDER — FENTANYL CITRATE (PF) 250 MCG/5ML IJ SOLN
INTRAMUSCULAR | Status: AC
  Filled 2018-10-01: qty 5

## 2018-10-01 MED ORDER — TRIAMCINOLONE ACETONIDE 40 MG/ML IJ SUSP
INTRAMUSCULAR | Status: AC
  Filled 2018-10-01: qty 5

## 2018-10-01 MED ORDER — HYDROCODONE-ACETAMINOPHEN 5-325 MG PO TABS
1.0000 | ORAL_TABLET | ORAL | Status: DC | PRN
  Administered 2018-10-01: 1 via ORAL
  Administered 2018-10-02: 2 via ORAL
  Filled 2018-10-01 (×2): qty 2

## 2018-10-01 MED ORDER — ACETAMINOPHEN ER 650 MG PO TBCR: 650.0000 mg | EXTENDED_RELEASE_TABLET | Freq: Two times a day (BID) | ORAL | Status: DC

## 2018-10-01 MED ORDER — DORZOLAMIDE HCL 2 % OP SOLN
1.0000 [drp] | Freq: Three times a day (TID) | OPHTHALMIC | Status: DC
  Filled 2018-10-01: qty 10

## 2018-10-01 MED ORDER — PROPOFOL 10 MG/ML IV BOLUS
INTRAVENOUS | Status: AC
  Filled 2018-10-01: qty 20

## 2018-10-01 MED ORDER — DEXAMETHASONE SODIUM PHOSPHATE 10 MG/ML IJ SOLN
INTRAMUSCULAR | Status: DC | PRN
  Administered 2018-10-01: 10 mg via INTRAVENOUS

## 2018-10-01 MED ORDER — PHENYLEPHRINE HCL 2.5 % OP SOLN
1.0000 [drp] | OPHTHALMIC | Status: AC | PRN
  Administered 2018-10-01 (×3): 1 [drp] via OPHTHALMIC
  Filled 2018-10-01: qty 2

## 2018-10-01 MED ORDER — TEMAZEPAM 15 MG PO CAPS: 15.0000 mg | ORAL_CAPSULE | Freq: Every evening | ORAL | Status: DC | PRN

## 2018-10-01 MED ORDER — HYPROMELLOSE (GONIOSCOPIC) 2.5 % OP SOLN
OPHTHALMIC | Status: AC
  Filled 2018-10-01: qty 15

## 2018-10-01 MED ORDER — PROPOFOL 10 MG/ML IV BOLUS
INTRAVENOUS | Status: DC | PRN
  Administered 2018-10-01: 150 mg via INTRAVENOUS

## 2018-10-01 MED ORDER — BSS PLUS IO SOLN
INTRAOCULAR | Status: AC
  Filled 2018-10-01: qty 500

## 2018-10-01 MED ORDER — DEXAMETHASONE SODIUM PHOSPHATE 10 MG/ML IJ SOLN
INTRAMUSCULAR | Status: AC
  Filled 2018-10-01: qty 1

## 2018-10-01 MED ORDER — METHOCARBAMOL 500 MG PO TABS: 500.0000 mg | ORAL_TABLET | Freq: Three times a day (TID) | ORAL | Status: DC | PRN

## 2018-10-01 MED ORDER — HEMOSTATIC AGENTS (NO CHARGE) OPTIME
TOPICAL | Status: DC | PRN
  Administered 2018-10-01: 1 via TOPICAL

## 2018-10-01 MED ORDER — PREDNISONE 5 MG PO TABS: 5.0000 mg | ORAL_TABLET | Freq: Every day | ORAL | Status: DC

## 2018-10-01 MED ORDER — BACITRACIN-POLYMYXIN B 500-10000 UNIT/GM OP OINT
TOPICAL_OINTMENT | OPHTHALMIC | Status: DC | PRN
  Administered 2018-10-01: 1 via OPHTHALMIC

## 2018-10-01 MED ORDER — STERILE WATER FOR INJECTION IJ SOLN
INTRAMUSCULAR | Status: DC | PRN
  Administered 2018-10-01: 20 mL

## 2018-10-01 MED ORDER — POLYMYXIN B SULFATE 500000 UNITS IJ SOLR
INTRAMUSCULAR | Status: AC
  Filled 2018-10-01: qty 500000

## 2018-10-01 MED ORDER — BUPIVACAINE HCL (PF) 0.75 % IJ SOLN
INTRAMUSCULAR | Status: AC
  Filled 2018-10-01: qty 10

## 2018-10-01 MED ORDER — LIDOCAINE 2% (20 MG/ML) 5 ML SYRINGE
INTRAMUSCULAR | Status: DC | PRN
  Administered 2018-10-01: 100 mg via INTRAVENOUS

## 2018-10-01 MED ORDER — ACETAMINOPHEN 325 MG PO TABS: 325.0000 mg | ORAL_TABLET | ORAL | Status: DC | PRN

## 2018-10-01 MED ORDER — HYDROCODONE-ACETAMINOPHEN 5-325 MG PO TABS
ORAL_TABLET | ORAL | Status: AC
  Filled 2018-10-01: qty 1

## 2018-10-01 MED ORDER — BACITRACIN-POLYMYXIN B 500-10000 UNIT/GM OP OINT
TOPICAL_OINTMENT | OPHTHALMIC | Status: AC
  Filled 2018-10-01: qty 3.5

## 2018-10-01 MED ORDER — BRIMONIDINE TARTRATE 0.2 % OP SOLN
1.0000 [drp] | Freq: Two times a day (BID) | OPHTHALMIC | Status: DC
  Filled 2018-10-01: qty 5

## 2018-10-01 MED ORDER — SODIUM CHLORIDE 0.9 % IJ SOLN
INTRAMUSCULAR | Status: AC
  Filled 2018-10-01: qty 10

## 2018-10-01 MED ORDER — SODIUM HYALURONATE 10 MG/ML IO SOLN
INTRAOCULAR | Status: AC
  Filled 2018-10-01: qty 0.85

## 2018-10-01 MED ORDER — BSS IO SOLN
INTRAOCULAR | Status: DC | PRN
  Administered 2018-10-01: 15 mL via INTRAOCULAR

## 2018-10-01 MED ORDER — EPINEPHRINE PF 1 MG/ML IJ SOLN
INTRAOCULAR | Status: DC | PRN
  Administered 2018-10-01: 11:00:00

## 2018-10-01 MED ORDER — GATIFLOXACIN 0.5 % OP SOLN
1.0000 [drp] | OPHTHALMIC | Status: AC | PRN
  Administered 2018-10-01 (×3): 1 [drp] via OPHTHALMIC
  Filled 2018-10-01: qty 2.5

## 2018-10-01 MED ORDER — CEFTAZIDIME 1 G IJ SOLR
INTRAMUSCULAR | Status: AC
  Filled 2018-10-01: qty 1

## 2018-10-01 MED ORDER — SODIUM HYALURONATE 10 MG/ML IO SOLN
INTRAOCULAR | Status: DC | PRN
  Administered 2018-10-01: 0.85 mL via INTRAOCULAR

## 2018-10-01 MED ORDER — DORZOLAMIDE HCL-TIMOLOL MAL 2-0.5 % OP SOLN
1.0000 [drp] | Freq: Two times a day (BID) | OPHTHALMIC | Status: DC
  Administered 2018-10-01: 1 [drp] via OPHTHALMIC
  Filled 2018-10-01: qty 10

## 2018-10-01 MED ORDER — MIDAZOLAM HCL 2 MG/2ML IJ SOLN
INTRAMUSCULAR | Status: DC | PRN
Start: 2018-10-01 — End: 2018-10-01
  Administered 2018-10-01: 1 mg via INTRAVENOUS

## 2018-10-01 MED ORDER — CYCLOPENTOLATE HCL 1 % OP SOLN
1.0000 [drp] | OPHTHALMIC | Status: AC | PRN
  Administered 2018-10-01 (×3): 1 [drp] via OPHTHALMIC
  Filled 2018-10-01: qty 2

## 2018-10-01 MED ORDER — MIDAZOLAM HCL 2 MG/2ML IJ SOLN
INTRAMUSCULAR | Status: AC
  Filled 2018-10-01: qty 2

## 2018-10-01 SURGICAL SUPPLY — 72 items
BALL CTTN LRG ABS STRL LF (GAUZE/BANDAGES/DRESSINGS) ×3
BLADE EYE CATARACT 19 1.4 BEAV (BLADE) IMPLANT
BLADE MVR KNIFE 19G (BLADE) IMPLANT
BLADE MVR KNIFE 20G (BLADE) ×3 IMPLANT
CABLE BIPOLOR RESECTION CORD (MISCELLANEOUS) ×2 IMPLANT
CANNULA VLV SOFT TIP 25G (OPHTHALMIC) ×1 IMPLANT
CANNULA VLV SOFT TIP 25GA (OPHTHALMIC) ×6 IMPLANT
CORDS BIPOLAR (ELECTRODE) ×1 IMPLANT
COTTONBALL LRG STERILE PKG (GAUZE/BANDAGES/DRESSINGS) ×9 IMPLANT
COVER MAYO STAND STRL (DRAPES) ×2 IMPLANT
COVER WAND RF STERILE (DRAPES) ×1 IMPLANT
DRAPE INCISE 51X51 W/FILM STRL (DRAPES) IMPLANT
DRAPE OPHTHALMIC 77X100 STRL (CUSTOM PROCEDURE TRAY) ×3 IMPLANT
EAGLE VIT/RET MICRO PIC 168 25 (MISCELLANEOUS) ×2 IMPLANT
ERASER HMR WETFIELD 23G BP (MISCELLANEOUS) ×3 IMPLANT
FILTER BLUE MILLIPORE (MISCELLANEOUS) IMPLANT
FILTER STRAW FLUID ASPIR (MISCELLANEOUS) ×3 IMPLANT
FORCEPS GRIESHABER ILM 25G A (INSTRUMENTS) IMPLANT
GAS AUTO FILL CONSTEL (OPHTHALMIC) ×3
GAS AUTO FILL CONSTELLATION (OPHTHALMIC) ×1 IMPLANT
GLOVE SS BIOGEL STRL SZ 6.5 (GLOVE) ×1 IMPLANT
GLOVE SS BIOGEL STRL SZ 7 (GLOVE) ×1 IMPLANT
GLOVE SUPERSENSE BIOGEL SZ 6.5 (GLOVE) ×2
GLOVE SUPERSENSE BIOGEL SZ 7 (GLOVE) ×2
GLOVE SURG 8.5 LATEX PF (GLOVE) ×3 IMPLANT
GLOVE SURG SS PI 6.5 STRL IVOR (GLOVE) ×4 IMPLANT
GOWN STRL REUS W/ TWL LRG LVL3 (GOWN DISPOSABLE) ×3 IMPLANT
GOWN STRL REUS W/TWL LRG LVL3 (GOWN DISPOSABLE) ×12
HANDLE PNEUMATIC FOR CONSTEL (OPHTHALMIC) ×2 IMPLANT
KIT BASIN OR (CUSTOM PROCEDURE TRAY) ×3 IMPLANT
KNIFE GRIESHABER SHARP 2.5MM (MISCELLANEOUS) IMPLANT
MICROPICK 25G (MISCELLANEOUS) ×3
NDL 18GX1X1/2 (RX/OR ONLY) (NEEDLE) ×1 IMPLANT
NDL 25GX 5/8IN NON SAFETY (NEEDLE) ×1 IMPLANT
NDL FILTER BLUNT 18X1 1/2 (NEEDLE) IMPLANT
NDL HYPO 30X.5 LL (NEEDLE) IMPLANT
NEEDLE 18GX1X1/2 (RX/OR ONLY) (NEEDLE) IMPLANT
NEEDLE 25GX 5/8IN NON SAFETY (NEEDLE) ×3 IMPLANT
NEEDLE FILTER BLUNT 18X 1/2SAF (NEEDLE) ×2
NEEDLE FILTER BLUNT 18X1 1/2 (NEEDLE) ×1 IMPLANT
NEEDLE HYPO 30X.5 LL (NEEDLE) IMPLANT
NS IRRIG 1000ML POUR BTL (IV SOLUTION) ×3 IMPLANT
PACK FRAGMATOME (OPHTHALMIC) IMPLANT
PACK VITRECTOMY CUSTOM (CUSTOM PROCEDURE TRAY) ×3 IMPLANT
PAD ARMBOARD 7.5X6 YLW CONV (MISCELLANEOUS) ×4 IMPLANT
PAK PIK VITRECTOMY CVS 25GA (OPHTHALMIC) ×3 IMPLANT
PENCIL BIPOLAR 25GA STR DISP (OPHTHALMIC RELATED) ×2 IMPLANT
PIC ILLUMINATED 25G (OPHTHALMIC) ×3
PICK MICROPICK 25G (MISCELLANEOUS) IMPLANT
PIK ILLUMINATED 25G (OPHTHALMIC) ×1 IMPLANT
PROBE LASER ILLUM FLEX CVD 25G (OPHTHALMIC) IMPLANT
REPL STRA BRUSH NDL (NEEDLE) ×1 IMPLANT
REPL STRA BRUSH NEEDLE (NEEDLE) ×3 IMPLANT
RESERVOIR BACK FLUSH (MISCELLANEOUS) ×3 IMPLANT
ROLLS DENTAL (MISCELLANEOUS) ×6 IMPLANT
SCRAPER DIAMOND 25GA (OPHTHALMIC RELATED) IMPLANT
SCRAPER DIAMOND DUST MEMBRANE (MISCELLANEOUS) ×3 IMPLANT
SPONGE SURGIFOAM ABS GEL 12-7 (HEMOSTASIS) ×3 IMPLANT
STOPCOCK 4 WAY LG BORE MALE ST (IV SETS) IMPLANT
SUT CHROMIC 7 0 TG140 8 (SUTURE) IMPLANT
SUT ETHILON 9 0 TG140 8 (SUTURE) ×3 IMPLANT
SUT POLY NON ABSORB 10-0 8 STR (SUTURE) IMPLANT
SUT SILK 4 0 RB 1 (SUTURE) IMPLANT
SUT VICRYL 7 0 TG140 8 (SUTURE) ×2 IMPLANT
SYR 10ML LL (SYRINGE) IMPLANT
SYR 20CC LL (SYRINGE) ×5 IMPLANT
SYR 5ML LL (SYRINGE) IMPLANT
SYR BULB 3OZ (MISCELLANEOUS) ×3 IMPLANT
SYR TB 1ML LUER SLIP (SYRINGE) ×3 IMPLANT
TUBING HIGH PRESS EXTEN 6IN (TUBING) IMPLANT
WATER STERILE IRR 1000ML POUR (IV SOLUTION) ×3 IMPLANT
WIPE INSTRUMENT VISIWIPE 73X73 (MISCELLANEOUS) IMPLANT

## 2018-10-01 NOTE — Anesthesia Procedure Notes (Signed)
Procedure Name: Intubation Date/Time: 10/01/2018 11:58 AM Performed by: Julieta Bellini, CRNA Pre-anesthesia Checklist: Patient identified, Emergency Drugs available, Suction available and Patient being monitored Patient Re-evaluated:Patient Re-evaluated prior to induction Oxygen Delivery Method: Circle system utilized Preoxygenation: Pre-oxygenation with 100% oxygen Induction Type: IV induction Ventilation: Mask ventilation without difficulty Laryngoscope Size: Mac and 3 Grade View: Grade I Tube type: Oral Tube size: 7.0 mm Number of attempts: 1 Airway Equipment and Method: Stylet Placement Confirmation: ETT inserted through vocal cords under direct vision,  positive ETCO2 and breath sounds checked- equal and bilateral Secured at: 21 cm Tube secured with: Tape Dental Injury: Teeth and Oropharynx as per pre-operative assessment

## 2018-10-01 NOTE — Anesthesia Postprocedure Evaluation (Signed)
Anesthesia Post Note  Patient: Pamela Pena  Procedure(s) Performed: 25 GAUGE PARS PLANA VITRECTOMY WITH 20 GAUGE MVR PORT RIGHT EYE  (Right Eye) LASER PHOTO ABLATION RIGHT EYE (Right Eye) AIR/FLUID EXCHANGE RIGHT EYE (Right Eye) MEMBRANE PEEL RIGHT EYE (Right Eye)     Patient location during evaluation: PACU Anesthesia Type: General Level of consciousness: sedated Pain management: pain level controlled Vital Signs Assessment: post-procedure vital signs reviewed and stable Respiratory status: spontaneous breathing and respiratory function stable Cardiovascular status: stable Postop Assessment: no apparent nausea or vomiting Anesthetic complications: no    Last Vitals:  Vitals:   10/01/18 1445 10/01/18 1505  BP:  (!) 174/82  Pulse: 90 85  Resp: (!) 8 12  Temp: (!) 36.3 C 36.9 C  SpO2: 96% 92%    Last Pain:  Vitals:   10/01/18 1505  TempSrc: Oral  PainSc:                  Patton Rabinovich DANIEL

## 2018-10-01 NOTE — Anesthesia Preprocedure Evaluation (Addendum)
Anesthesia Evaluation  Patient identified by MRN, date of birth, ID band Patient awake    Reviewed: Allergy & Precautions, NPO status , Patient's Chart, lab work & pertinent test results  History of Anesthesia Complications Negative for: history of anesthetic complications  Airway Mallampati: II  TM Distance: >3 FB Neck ROM: Full    Dental  (+) Dental Advisory Given, Partial Upper   Pulmonary neg pulmonary ROS, former smoker,    Pulmonary exam normal        Cardiovascular negative cardio ROS Normal cardiovascular exam     Neuro/Psych negative neurological ROS  negative psych ROS   GI/Hepatic negative GI ROS, Neg liver ROS,   Endo/Other  negative endocrine ROS  Renal/GU negative Renal ROS  negative genitourinary   Musculoskeletal negative musculoskeletal ROS (+)   Abdominal   Peds negative pediatric ROS (+)  Hematology negative hematology ROS (+)   Anesthesia Other Findings   Reproductive/Obstetrics negative OB ROS                            Anesthesia Physical Anesthesia Plan  ASA: II  Anesthesia Plan: General   Post-op Pain Management:    Induction: Intravenous  PONV Risk Score and Plan: 4 or greater and Ondansetron, Scopolamine patch - Pre-op, Diphenhydramine and Treatment may vary due to age or medical condition  Airway Management Planned: Oral ETT  Additional Equipment:   Intra-op Plan:   Post-operative Plan: Extubation in OR  Informed Consent: I have reviewed the patients History and Physical, chart, labs and discussed the procedure including the risks, benefits and alternatives for the proposed anesthesia with the patient or authorized representative who has indicated his/her understanding and acceptance.   Dental advisory given  Plan Discussed with: CRNA, Anesthesiologist and Surgeon  Anesthesia Plan Comments:         Anesthesia Quick Evaluation

## 2018-10-01 NOTE — H&P (Signed)
I examined the patient today and there is no change in the medical status 

## 2018-10-01 NOTE — Op Note (Signed)
NAME: Pamela Pena, WUNSCHEL MEDICAL RECORD OA:[id number] ACCOUNT [id number] DATE OF BIRTH:1951/08/13 FACILITY: [facility] LOCATION: [facility] PHYSICIAN:JOHN D. MATTHEWS, MD  OPERATIVE REPORT  DATE OF PROCEDURE:  10/01/2018  ADMISSION DIAGNOSES:  Preretinal fibrosis, right eye.  PROCEDURES:  Pars plana vitrectomy, laser photocoagulation, membrane peel, gas fluid exchange in the right eye.  SURGEON:  Alan Mulder, MD  ASSISTANT:  Rosalie Doctor, SA  ANESTHESIA:  General.  DESCRIPTION OF PROCEDURE:  Usual prep and drape.  The indirect ophthalmoscope laser was moved into place and 705 burns were placed around the retinal periphery.  There was a large retinal break at 3 o'clock.  This was treated with several rows of  indirect ophthalmoscope laser.  The laser marks were 1000 microns in size and 0.1 seconds each.  The power was between 600 and 800 mW.  Once this was accomplished, attention was carried to the pars plana area.  Twenty-five gauge trocars were placed at 8  and 10 o'clock.  A 3-layered MVR incision was made at 2 o'clock to admit 20-gauge and 25-gauge instruments.  A contact lens ring was anchored into place at 6 and 12 o'clock with a 7-0 Vicryl suture.  Provisc was placed on the corneal surface, and the  flat contact lens was placed.  The pars plana vitrectomy was begun just behind the pseudophacos.  The vitreous was filled with small white dots and other debris from her retinal break.  This vitreous was carefully removed under low suction and rapid  cutting.  A complete vitrectomy was carried out.  The core vitrectomy was carried out first, and then vitrectomy into the mid periphery and far periphery was performed.  The 30-degree prismatic lens was used for a peripheral vitrectomy.  The attention  was carried to the macular region where a triangular-shaped brown fibrotic membrane was seen along the lower arcades.  This membrane was freed with a lighted pick and a 25-gauge pick from the lower  attachments.  The lateral attachments were elevated, but  some small hemorrhage occurred in this region, and so additional removal of the dark brown membrane was not carried out.  The attention was carried around the macular region where the internal limiting membrane was engaged and then removed with multiple  instruments:  The diamond-dusted membrane scraper, silicone-tipped suction line, a 27-gauge pick and 25-gauge pick.  Once all of the surface membranes were removed, the vitrector was repositioned in the eye and additional fibrotic fragments were  removed.  The fibrous membranes were trimmed down to the retinal surface.  There was minimal hemorrhage.  A 30% gas fluid exchange was then carried out.  The instruments were removed from the eye.  The trocars were removed from the eye.  The scleral  wound was closed with 2 interrupted 9-0 nylon sutures.  The wounds were tested and found to be secure.  Polymyxin and ceftazidime were rinsed around the globe for postop pain.  Decadron 10 mg was injected into the lower subconjunctival space.  Polysporin  ophthalmic ointment, a patch and a shield were placed.  The closing pressure was 10 with a Barraquer tonometer.  COMPLICATIONS:  None.  DURATION:  2 hours.  The patient was awakened and taken to recovery in satisfactory condition.  LN/NUANCE  D:10/01/2018 T:10/01/2018 JOB:[id number]

## 2018-10-01 NOTE — Transfer of Care (Signed)
Immediate Anesthesia Transfer of Care Note  Patient: Pamela Pena  Procedure(s) Performed: 25 GAUGE PARS PLANA VITRECTOMY WITH 20 GAUGE MVR PORT RIGHT EYE  (Right Eye) LASER PHOTO ABLATION RIGHT EYE (Right Eye) AIR/FLUID EXCHANGE RIGHT EYE (Right Eye) MEMBRANE PEEL RIGHT EYE (Right Eye)  Patient Location: PACU  Anesthesia Type:General  Level of Consciousness: awake, alert , oriented and patient cooperative  Airway & Oxygen Therapy: Patient Spontanous Breathing and Patient connected to nasal cannula oxygen  Post-op Assessment: Report given to RN, Post -op Vital signs reviewed and stable and Patient moving all extremities X 4  Post vital signs: Reviewed and stable  Last Vitals:  Vitals Value Taken Time  BP    Temp    Pulse    Resp    SpO2      Last Pain:  Vitals:   10/01/18 0957  TempSrc:   PainSc: 0-No pain         Complications: No apparent anesthesia complications

## 2018-10-01 NOTE — Brief Op Note (Signed)
Brief Operative note   Preoperative diagnosis:  pre retinal fibrosis right eye Postoperative diagnosis  * same  Procedures: Pars plana vitrectomy, membrane peel, laser, gas injection right eye  Surgeon:  Sherrie George, MD...  Assistant:  Rosalie Doctor SA  Charyl Dancer RN  Anesthesia: General  Specimen: none  Estimated blood loss:  1cc  Complications: none  Patient sent to PACU in good condition  Composed by Sherrie George MD  Dictation number: [id number]

## 2018-10-02 ENCOUNTER — Encounter (HOSPITAL_COMMUNITY): Payer: Self-pay | Admitting: Ophthalmology

## 2018-10-02 DIAGNOSIS — H35341 Macular cyst, hole, or pseudohole, right eye: Secondary | ICD-10-CM | POA: Diagnosis not present

## 2018-10-02 MED ORDER — GATIFLOXACIN 0.5 % OP SOLN: 1.0000 [drp] | Freq: Four times a day (QID) | OPHTHALMIC | Status: DC

## 2018-10-02 MED ORDER — BACITRACIN-POLYMYXIN B 500-10000 UNIT/GM OP OINT: 1.0000 "application " | TOPICAL_OINTMENT | Freq: Three times a day (TID) | OPHTHALMIC | 0 refills | Status: DC

## 2018-10-02 MED ORDER — PREDNISOLONE ACETATE 1 % OP SUSP: 1.0000 [drp] | Freq: Four times a day (QID) | OPHTHALMIC | 0 refills | Status: DC

## 2018-10-02 NOTE — Progress Notes (Signed)
Pt discharge to home with daughter, alert and oriented, no complain of pain at this time, discharge instructions given.

## 2018-10-02 NOTE — Progress Notes (Signed)
10/02/2018, 6:18 AM  Mental Status:  Awake, Alert, Oriented  Anterior segment: Cornea  Clear    Anterior Chamber Clear    Lens:   IOL,   Intra Ocular Pressure 12 mmHg with Tonopen  Vitreous: Clear 20%gas bubble   Retina:  Attached Good laser reaction  view  Impression: Excellent result Retina attached   Final Diagnosis: Principal Problem:   Preretinal fibrosis, right   Plan: start post operative eye drops.  Discharge to home.  Give post operative instructions  Sherrie George 10/02/2018, 6:18 AM

## 2018-10-04 ENCOUNTER — Ambulatory Visit (INDEPENDENT_AMBULATORY_CARE_PROVIDER_SITE_OTHER): Payer: 59 | Admitting: Ophthalmology

## 2018-10-04 ENCOUNTER — Encounter (INDEPENDENT_AMBULATORY_CARE_PROVIDER_SITE_OTHER): Payer: Self-pay | Admitting: Ophthalmology

## 2018-10-04 DIAGNOSIS — Z961 Presence of intraocular lens: Secondary | ICD-10-CM

## 2018-10-04 DIAGNOSIS — H35371 Puckering of macula, right eye: Secondary | ICD-10-CM

## 2018-10-04 DIAGNOSIS — H33311 Horseshoe tear of retina without detachment, right eye: Secondary | ICD-10-CM

## 2018-10-04 DIAGNOSIS — H3581 Retinal edema: Secondary | ICD-10-CM

## 2018-10-04 NOTE — Progress Notes (Signed)
[facility] - Clinic Note  10/04/2018     CHIEF COMPLAINT Patient presents for Post-op Follow-up and Eye Problem   HISTORY OF PRESENT ILLNESS: Pamela Pena is a 67 y.o. female who presents to the clinic today for:   HPI    Post-op Follow-up    In right eye.  Vision is stable.  I, the attending physician,  performed the HPI with the patient and updated documentation appropriately.          Comments    Patient of DR. Matthews/Eval. After fall this am. Patient states she fell this am hit her chin and chipping a tooth on the right, Pt had a PPV On Tues 10/01/18. By Dr. Ashley Royalty . Patient states she see a" half of something"in her OD, denies flashes of light and ocular pain.        Last edited by Rennis Chris, MD on 10/04/2018  1:37 PM. (History)    pt states she fell a few days ago on the pavement, she states she landed on her side, chipped her tooth, and the knee she just had sx on, pt states she has not noticed any changes in vision, pt states she sees about 4-5 gas bubbles since fall  Referring physician: Merri Brunette, MD [address] [place], [place] [address]  HISTORICAL INFORMATION:   Selected notes from the MEDICAL RECORD NUMBER    CURRENT MEDICATIONS: Current Outpatient Medications (Ophthalmic Drugs)  Medication Sig  . bacitracin-polymyxin b (POLYSPORIN) ophthalmic ointment Place 1 application into the right eye 3 (three) times daily. apply to eye every 12 hours while awake  . gatifloxacin (ZYMAXID) 0.5 % SOLN Place 1 drop into the right eye 4 (four) times daily.  . prednisoLONE acetate (PRED FORTE) 1 % ophthalmic suspension Place 1 drop into the right eye 4 (four) times daily.   No current facility-administered medications for this visit.  (Ophthalmic Drugs)   Current Outpatient Medications (Other)  Medication Sig  . acetaminophen (TYLENOL) 650 MG CR tablet Take 650 mg by mouth 2 (two) times daily.  . Ascorbic Acid (VITAMIN C) 1000  MG tablet Take 1,000 mg by mouth 2 (two) times daily.  . Biotin (BIOTIN 5000) 5 MG CAPS Take 5 mg by mouth daily.   . Calcium Carbonate (CALCIUM 600 PO) Take 600 mg by mouth 2 (two) times daily.  . celecoxib (CELEBREX) 200 MG capsule Take 1 capsule (200 mg total) by mouth 2 (two) times daily.  . Cholecalciferol (VITAMIN D3) 5000 units CAPS Take 5,000 Units by mouth daily.  Marland Kitchen docusate sodium (COLACE) 100 MG capsule Take 1 capsule (100 mg total) by mouth 2 (two) times daily.  . ferrous sulfate (FERROUSUL) 325 (65 FE) MG tablet Take 1 tablet (325 mg total) by mouth 3 (three) times daily with meals.  . folic acid (FOLVITE) 400 MCG tablet Take 400 mcg by mouth daily.  Marland Kitchen HYDROcodone-acetaminophen (NORCO/VICODIN) 5-325 MG tablet Take 1 tablet by mouth every 6 (six) hours as needed (pain).   . LUTEIN-ZEAXANTHIN-BILBERRY PO Take 1 capsule by mouth daily. HERBAVISION  . Magnesium 500 MG CAPS Take 500 mg by mouth every evening.   . Melatonin 3 MG CAPS Take 3 mg by mouth at bedtime.  . Menaquinone-7 (VITAMIN K2) 100 MCG CAPS Take 100 mcg by mouth 2 (two) times daily.  . methocarbamol (ROBAXIN) 500 MG tablet Take 500 mg by mouth every 8 (eight) hours as needed for muscle spasms.   . methotrexate (RHEUMATREX)  2.5 MG tablet Take 1 tablet (2.5 mg total) by mouth 2 (two) times a week. Caution:Chemotherapy. Protect from light.  Takes 6 tablets per week , 3 tablets in the morning and 3 at HS   HOLD FOR 2 WEEKS AFTER SURGERY. (Patient taking differently: Take 2.5 mg by mouth See admin instructions. Caution:Chemotherapy. Protect from light.  Takes 6 tablets per week , 3 tablets (7.5 mg) in the morning on Mondays & take 3 tablets (7.5 mg) by mouth at bedtime on Mondays ONLY and 3 at HS   HOLD FOR 2 WEEKS AFTER SURGERY.)  . naproxen (NAPROSYN) 500 MG tablet Take 500 mg by mouth 2 (two) times daily with a meal.  . Omega-3 Fatty Acids (FISH OIL) 1000 MG CAPS Take 1,000 mg by mouth 2 (two) times daily.   Marland Kitchen oxyCODONE  (OXY IR/ROXICODONE) 5 MG immediate release tablet Take 1-2 tablets (5-10 mg total) by mouth every 4 (four) hours as needed for moderate pain or severe pain.  . polyethylene glycol (MIRALAX / GLYCOLAX) packet Take 17 g by mouth 2 (two) times daily. (Patient taking differently: Take 17 g by mouth at bedtime. )  . predniSONE (DELTASONE) 5 MG tablet Take 5 mg by mouth daily.   . promethazine (PHENERGAN) 12.5 MG tablet Take 1 tablet (12.5 mg total) by mouth every 6 (six) hours as needed for nausea or vomiting.  . TURMERIC PO Take 1,800 mg by mouth 2 (two) times daily.  . vitamin E 400 UNIT capsule Take 400 Units by mouth daily.  Marland Kitchen zolpidem (AMBIEN) 5 MG tablet Take 5 mg by mouth at bedtime.    No current facility-administered medications for this visit.  (Other)      REVIEW OF SYSTEMS: ROS    Positive for: Eyes   Negative for: Constitutional, Gastrointestinal, Neurological, Skin, Genitourinary, Musculoskeletal, HENT, Endocrine, Cardiovascular, Respiratory, Psychiatric, Allergic/Imm, Heme/Lymph   Last edited by Eldridge Scot, LPN on 28/0/0349 12:51 PM. (History)       ALLERGIES No Known Allergies  PAST MEDICAL HISTORY Past Medical History:  Diagnosis Date  . Disc degeneration, lumbar   . Piriformis syndrome    Fiberneuropathy  . RA (rheumatoid arthritis) (HCC)    Past Surgical History:  Procedure Laterality Date  . 25 GAUGE PARS PLANA VITRECTOMY WITH 20 GAUGE MVR PORT FOR MACULAR HOLE Right 10/01/2018   Procedure: 25 GAUGE PARS PLANA VITRECTOMY WITH 20 GAUGE MVR PORT RIGHT EYE ;  Surgeon: Sherrie George, MD;  Location: [facility] OR;  Service: Ophthalmology;  Laterality: Right;  . AIR/FLUID EXCHANGE Right 10/01/2018   Procedure: AIR/FLUID EXCHANGE RIGHT EYE;  Surgeon: Sherrie George, MD;  Location: [facility] OR;  Service: Ophthalmology;  Laterality: Right;  . CESAREAN SECTION  1970  . LASER PHOTO ABLATION Right 10/01/2018   Procedure: LASER PHOTO ABLATION RIGHT EYE;  Surgeon: Sherrie George, MD;  Location: [facility] OR;  Service: Ophthalmology;  Laterality: Right;  . MEMBRANE PEEL Right 10/01/2018   Procedure: MEMBRANE PEEL RIGHT EYE;  Surgeon: Sherrie George, MD;  Location: [facility] OR;  Service: Ophthalmology;  Laterality: Right;  . TONSILLECTOMY     age 15  . 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 TOTAL KNEE ARTHROPLASTY Left 07/16/2018   Procedure: LEFT TOTAL KNEE ARTHROPLASTY;  Surgeon: Durene Romans, MD;  Location: [facility] ORS;  Service: Orthopedics;  Laterality: Left;  Adductor  Block    FAMILY HISTORY Family History  Problem Relation Age of Onset  . Breast cancer Maternal Aunt   . CAD Mother   . Cancer Father   . Neuropathy Neg Hx     SOCIAL HISTORY Social History   Tobacco Use  . Smoking status: Former Smoker    Years: 6.00    Last attempt to quit: 1974    Years since quitting: 45.8  . Smokeless tobacco: Never Used  Substance Use Topics  . Alcohol use: Not Currently    Comment: quit 1974  . Drug use: Not Currently    Comment: some use for 5 years after high school.          OPHTHALMIC EXAM:  Base Eye Exam    Visual Acuity (Snellen - Linear)      Right Left   Dist cc 20/200 20/20   Dist ph cc 20/150    Correction:  Glasses       Tonometry (Tonopen, 12:59 PM)      Right Left   Pressure 18 14       Pupils      Dark Light Shape React APD   Right 5  Round  None   Left 5 4 Round Brisk None       Visual Fields (Counting fingers)      Left Right    Full Full       Extraocular Movement      Right Left    Full, Ortho Full, Ortho       Neuro/Psych    Oriented x3:  Yes   Mood/Affect:  Normal       Dilation    Right eye:  1.0% Mydriacyl, 2.5% Phenylephrine @ 1:42 PM        Slit Lamp and Fundus Exam    Slit Lamp Exam      Right Left   Lids/Lashes Dermatochalasis - upper lid, lashes cut     Conjunctiva/Sclera Subconjunctival hemorrhage superiorly, nylon suture at 0200    Cornea ointment, EBMD, 1+ Punctate epithelial erosions    Anterior Chamber Deep and clear    Iris Round and poor dilation to 25mm    Lens Posterior chamber intraocular lens, 1+ Posterior capsular opacification    Vitreous post vitrectomy, 5-10% gas bubble superiorly, 1 small satellite bubble on nasal side        Fundus Exam      Right Left   Disc Pink and Sharp    C/D Ratio 0.3    Macula attached, ERM improved, No heme or edema    Vessels Vascular attenuation, pigmented preretinal fibrosis along IT arcade    Periphery Attached with good 360 peripheral laser, good area of laser at 1030-1100 surrounding horseshoe tear           IMAGING AND PROCEDURES  Imaging and Procedures for @TODAY @  OCT, Retina - OU - Both Eyes       Right Eye Quality was good. Central Foveal Thickness: 354. Progression has improved. Findings include normal foveal contour, macular pucker, no IRF, no SRF (ERM gone, ?ORA).   Left Eye Quality was good. Central Foveal Thickness: 246. Progression has been stable. Findings include normal foveal contour, no SRF, no IRF.   Notes *Images captured and stored on drive  Diagnosis / Impression:  NFP, No IRF/SRF OU OD: ERM/preretinal fibrosis improved; foveal contour improved; mild residual pucker   Clinical management:  See below  Abbreviations: NFP - Normal foveal profile. CME -  cystoid macular edema. PED - pigment epithelial detachment. IRF - intraretinal fluid. SRF - subretinal fluid. EZ - ellipsoid zone. ERM - epiretinal membrane. ORA - outer retinal atrophy. ORT - outer retinal tubulation. SRHM - subretinal hyper-reflective material                 ASSESSMENT/PLAN:    ICD-10-CM   1. Epiretinal membrane (ERM) of right eye H35.371   2. Retinal tear of right eye H33.311   3. Retinal edema H35.81 OCT, Retina - OU - Both Eyes  4. Pseudophakia of both eyes Z96.1     1.  ERM/preretinal fibrosis OD - s/p PPV with membrane peel, laser, and gas OD, 11.05.19, Dr. Alan Mulder - ERM improved and foveal contour already returning to normal OD - 5-10% air bubble in place - good peripheral laser in place - reassured pt that no damage was done to surgical eye from fall - continue post op drops per Dr. Ashley Royalty - f/u as scheduled next week for POW1 visit  2. Retinal Tear OD   - HST at 1030 - s/p laser retinopexy -- good laser in place  3. No retinal edema on exam or OCT  4. Pseudophakia OU  - s/p CE/IOL OU  - beautiful surgery, doing well  - monitor   Ophthalmic Meds Ordered this visit:  No orders of the defined types were placed in this encounter.      Return for as scheduled.  There are no Patient Instructions on file for this visit.  This document serves as a record of services personally performed by Karie Chimera, MD, PhD. It was created on their behalf by Laurian Brim, OA, an ophthalmic assistant. The creation of this record is the provider's dictation and/or activities during the visit.    Electronically signed by: Laurian Brim, OA  11.08.19 2:25 PM    Explained the diagnoses, plan, and follow up with the patient and they expressed understanding.  Patient expressed understanding of the importance of proper follow up care.   Karie Chimera, M.D., Ph.D. Diseases & Surgery of the Retina and Vitreous [facility]   I have reviewed the above documentation for accuracy and completeness, and I agree with the above. Karie Chimera, M.D., Ph.D. 10/04/18 2:25 PM     Abbreviations: M myopia (nearsighted); A astigmatism; H hyperopia (farsighted); P presbyopia; Mrx spectacle prescription;  CTL contact lenses; OD right eye; OS left eye; OU both eyes  XT exotropia; ET esotropia; PEK punctate epithelial keratitis; PEE punctate epithelial erosions; DES dry eye syndrome; MGD meibomian gland dysfunction; ATs artificial tears;  PFAT's preservative free artificial tears; NSC nuclear sclerotic cataract; PSC posterior subcapsular cataract; ERM epi-retinal membrane; PVD posterior vitreous detachment; RD retinal detachment; DM diabetes mellitus; DR diabetic retinopathy; NPDR non-proliferative diabetic retinopathy; PDR proliferative diabetic retinopathy; CSME clinically significant macular edema; DME diabetic macular edema; dbh dot blot hemorrhages; CWS cotton wool spot; POAG primary open angle glaucoma; C/D cup-to-disc ratio; HVF humphrey visual field; GVF goldmann visual field; OCT optical coherence tomography; IOP intraocular pressure; BRVO Branch retinal vein occlusion; CRVO central retinal vein occlusion; CRAO central retinal artery occlusion; BRAO branch retinal artery occlusion; RT retinal tear; SB scleral buckle; PPV pars plana vitrectomy; VH Vitreous hemorrhage; PRP panretinal laser photocoagulation; IVK intravitreal kenalog; VMT vitreomacular traction; MH Macular hole;  NVD neovascularization of the disc; NVE neovascularization elsewhere; AREDS age related eye disease study; ARMD age related macular degeneration; POAG primary open angle glaucoma; EBMD epithelial/anterior  basement membrane dystrophy; ACIOL anterior chamber intraocular lens; IOL intraocular lens; PCIOL posterior chamber intraocular lens; Phaco/IOL phacoemulsification with intraocular lens placement; [place] photorefractive keratectomy; LASIK laser assisted in situ keratomileusis; HTN hypertension; DM diabetes mellitus; COPD chronic obstructive pulmonary disease

## 2018-10-08 ENCOUNTER — Encounter (INDEPENDENT_AMBULATORY_CARE_PROVIDER_SITE_OTHER): Payer: 59 | Admitting: Ophthalmology

## 2018-10-08 DIAGNOSIS — H35371 Puckering of macula, right eye: Secondary | ICD-10-CM

## 2018-10-29 ENCOUNTER — Encounter (INDEPENDENT_AMBULATORY_CARE_PROVIDER_SITE_OTHER): Payer: 59 | Admitting: Ophthalmology

## 2018-10-29 DIAGNOSIS — H35371 Puckering of macula, right eye: Secondary | ICD-10-CM

## 2018-12-13 ENCOUNTER — Encounter: Payer: Self-pay | Admitting: Neurology

## 2018-12-16 NOTE — Progress Notes (Deleted)
NEUROLOGY CONSULTATION NOTE  Pamela Pena MRN: [id number] DOB: 08/26/1951  Referring provider: Merri Brunette, MD Primary care provider: Merri Brunette, MD  Reason for consult:  Numbness in feet  HISTORY OF PRESENT ILLNESS: Pamela Pena is a 68 year old***-handed female with rheumatoid arthritis, degenerative disc disease of the lumbar spine who presents for numbness in her feet.  History supplemented by rheumatologist and prior neurologist notes.  ***  Labs from April included:  Hgb A1c 5.2, B1 202.5, B12 1,271, MMA 74, folate >20, TSH 0.369, transglutaminase IgA negative, antigliadin IgA and IgG antibodies negative, heavy metal screen negative, SPEP/IFE negative for M-spike, and elevated B6 99.3.  She was advised to discontinue pyridoxal phosphate.  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    She has been treated by Dr. Ethelene Hal of pain management for sciatic pain, piriformis syndrome and sacroiliac joint pain.  She is treated by Dr. Dierdre Forth for her rheumatoid arthritis.  She takes methotrexate and prednisone.  PAST MEDICAL HISTORY: Past Medical History:  Diagnosis Date  . Disc degeneration, lumbar   . Piriformis syndrome    Fiberneuropathy  . RA (rheumatoid arthritis) (HCC)     PAST SURGICAL HISTORY: Past Surgical History:  Procedure Laterality Date  . 25 GAUGE PARS PLANA VITRECTOMY WITH 20 GAUGE MVR PORT FOR MACULAR HOLE Right 10/01/2018   Procedure: 25 GAUGE PARS PLANA VITRECTOMY WITH 20 GAUGE MVR PORT RIGHT EYE ;  Surgeon: Sherrie George, MD;  Location: [facility] OR;  Service: Ophthalmology;  Laterality: Right;  . AIR/FLUID EXCHANGE Right 10/01/2018   Procedure: AIR/FLUID EXCHANGE RIGHT EYE;  Surgeon: Sherrie George, MD;  Location: [facility] OR;  Service: Ophthalmology;  Laterality: Right;  . CESAREAN SECTION  1970  . LASER PHOTO ABLATION Right 10/01/2018   Procedure: LASER PHOTO ABLATION RIGHT EYE;  Surgeon: Sherrie George, MD;  Location: [facility] OR;  Service:  Ophthalmology;  Laterality: Right;  . MEMBRANE PEEL Right 10/01/2018   Procedure: MEMBRANE PEEL RIGHT EYE;  Surgeon: Sherrie George, MD;  Location: [facility] OR;  Service: Ophthalmology;  Laterality: Right;  . TONSILLECTOMY     age 50  . 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 TOTAL KNEE ARTHROPLASTY Left 07/16/2018   Procedure: LEFT TOTAL KNEE ARTHROPLASTY;  Surgeon: Durene Romans, MD;  Location: [facility] OR[facility];  Service: Orthopedics;  Laterality: Left;  Adductor Block    MEDICATIONS: Current Outpatient Medications on File Prior to Visit  Medication Sig Dispense Refill  . acetaminophen (TYLENOL) 650 MG CR tablet Take 650 mg by mouth 2 (two) times daily.    . Ascorbic Acid (VITAMIN C) 1000 MG tablet Take 1,000 mg by mouth 2 (two) times daily.    . bacitracin-polymyxin b (POLYSPORIN) ophthalmic ointment Place 1 application into the right eye 3 (three) times daily. apply to eye every 12 hours while awake 3.5 g 0  . Biotin (BIOTIN 5000) 5 MG CAPS Take 5 mg by mouth daily.     . Calcium Carbonate (CALCIUM 600 PO) Take 600 mg by mouth 2 (two) times daily.    . celecoxib (CELEBREX) 200 MG capsule Take 1 capsule (200 mg total) by mouth 2 (two) times daily. 60 capsule 0  . Cholecalciferol (VITAMIN D3) 5000 units CAPS Take 5,000 Units by mouth daily.    Marland Kitchen docusate sodium (COLACE) 100 MG capsule Take 1 capsule (100 mg total) by  mouth 2 (two) times daily. 10 capsule 0  . ferrous sulfate (FERROUSUL) 325 (65 FE) MG tablet Take 1 tablet (325 mg total) by mouth 3 (three) times daily with meals.  3  . folic acid (FOLVITE) 400 MCG tablet Take 400 mcg by mouth daily.    Marland Kitchen gatifloxacin (ZYMAXID) 0.5 % SOLN Place 1 drop into the right eye 4 (four) times daily.    Marland Kitchen HYDROcodone-acetaminophen (NORCO/VICODIN) 5-325 MG tablet Take 1 tablet by mouth every 6 (six) hours as needed (pain).       . LUTEIN-ZEAXANTHIN-BILBERRY PO Take 1 capsule by mouth daily. HERBAVISION    . Magnesium 500 MG CAPS Take 500 mg by mouth every evening.     . Melatonin 3 MG CAPS Take 3 mg by mouth at bedtime.    . Menaquinone-7 (VITAMIN K2) 100 MCG CAPS Take 100 mcg by mouth 2 (two) times daily.    . methocarbamol (ROBAXIN) 500 MG tablet Take 500 mg by mouth every 8 (eight) hours as needed for muscle spasms.     . methotrexate (RHEUMATREX) 2.5 MG tablet Take 1 tablet (2.5 mg total) by mouth 2 (two) times a week. Caution:Chemotherapy. Protect from light.  Takes 6 tablets per week , 3 tablets in the morning and 3 at HS   HOLD FOR 2 WEEKS AFTER SURGERY. (Patient taking differently: Take 2.5 mg by mouth See admin instructions. Caution:Chemotherapy. Protect from light.  Takes 6 tablets per week , 3 tablets (7.5 mg) in the morning on Mondays & take 3 tablets (7.5 mg) by mouth at bedtime on Mondays ONLY and 3 at HS   HOLD FOR 2 WEEKS AFTER SURGERY.) 4 tablet 0  . naproxen (NAPROSYN) 500 MG tablet Take 500 mg by mouth 2 (two) times daily with a meal.    . Omega-3 Fatty Acids (FISH OIL) 1000 MG CAPS Take 1,000 mg by mouth 2 (two) times daily.     Marland Kitchen oxyCODONE (OXY IR/ROXICODONE) 5 MG immediate release tablet Take 1-2 tablets (5-10 mg total) by mouth every 4 (four) hours as needed for moderate pain or severe pain. 60 tablet 0  . polyethylene glycol (MIRALAX / GLYCOLAX) packet Take 17 g by mouth 2 (two) times daily. (Patient taking differently: Take 17 g by mouth at bedtime. ) 14 each 0  . prednisoLONE acetate (PRED FORTE) 1 % ophthalmic suspension Place 1 drop into the right eye 4 (four) times daily. 5 mL 0  . predniSONE (DELTASONE) 5 MG tablet Take 5 mg by mouth daily.     . promethazine (PHENERGAN) 12.5 MG tablet Take 1 tablet (12.5 mg total) by mouth every 6 (six) hours as needed for nausea or vomiting. 30 tablet 0  . TURMERIC PO Take 1,800 mg by mouth 2 (two) times daily.    . vitamin E 400 UNIT capsule Take 400  Units by mouth daily.    Marland Kitchen zolpidem (AMBIEN) 5 MG tablet Take 5 mg by mouth at bedtime.      No current facility-administered medications on file prior to visit.     ALLERGIES: No Known Allergies  FAMILY HISTORY: Family History  Problem Relation Age of Onset  . Breast cancer Maternal Aunt   . CAD Mother   . Cancer Father   . Neuropathy Neg Hx    ***.  SOCIAL HISTORY: Social History   Socioeconomic History  . Marital status: Widowed    Spouse name: Not on file  . Number of children: 2  . Years of education:  12  . Highest education level: Not on file  Occupational History  . Not on file  Social Needs  . Financial resource strain: Not on file  . Food insecurity:    Worry: Not on file    Inability: Not on file  . Transportation needs:    Medical: Not on file    Non-medical: Not on file  Tobacco Use  . Smoking status: Former Smoker    Years: 6.00    Last attempt to quit: 1974    Years since quitting: 46.0  . Smokeless tobacco: Never Used  Substance and Sexual Activity  . Alcohol use: Not Currently    Comment: quit 1974  . Drug use: Not Currently    Comment: some use for 5 years after high school.   . Sexual activity: Not on file  Lifestyle  . Physical activity:    Days per week: Not on file    Minutes per session: Not on file  . Stress: Not on file  Relationships  . Social connections:    Talks on phone: Not on file    Gets together: Not on file    Attends religious service: Not on file    Active member of club or organization: Not on file    Attends meetings of clubs or organizations: Not on file    Relationship status: Not on file  . Intimate partner violence:    Fear of current or ex partner: Not on file    Emotionally abused: Not on file    Physically abused: Not on file    Forced sexual activity: Not on file  Other Topics Concern  . Not on file  Social History Narrative   Lives at home. Her mother lives with her and she is her caretaker   Right  handed   Drinks 2-4 cups of caffeine daily    REVIEW OF SYSTEMS: Constitutional: No fevers, chills, or sweats, no generalized fatigue, change in appetite Eyes: No visual changes, double vision, eye pain Ear, nose and throat: No hearing loss, ear pain, nasal congestion, sore throat Cardiovascular: No chest pain, palpitations Respiratory:  No shortness of breath at rest or with exertion, wheezes GastrointestinaI: No nausea, vomiting, diarrhea, abdominal pain, fecal incontinence Genitourinary:  No dysuria, urinary retention or frequency Musculoskeletal:  No neck pain, back pain Integumentary: No rash, pruritus, skin lesions Neurological: as above Psychiatric: No depression, insomnia, anxiety Endocrine: No palpitations, fatigue, diaphoresis, mood swings, change in appetite, change in weight, increased thirst Hematologic/Lymphatic:  No purpura, petechiae. Allergic/Immunologic: no itchy/runny eyes, nasal congestion, recent allergic reactions, rashes  PHYSICAL EXAM: *** General: No acute distress.  Patient appears ***-groomed.  *** Head:  Normocephalic/atraumatic Eyes:  fundi examined but not visualized Neck: supple, no paraspinal tenderness, full range of motion Back: No paraspinal tenderness Heart: regular rate and rhythm Lungs: Clear to auscultation bilaterally. Vascular: No carotid bruits. Neurological Exam: Mental status: alert and oriented to person, place, and time, recent and remote memory intact, fund of knowledge intact, attention and concentration intact, speech fluent and not dysarthric, language intact. Cranial nerves: CN I: not tested CN II: pupils equal, round and reactive to light, visual fields intact CN III, IV, VI:  full range of motion, no nystagmus, no ptosis CN V: facial sensation intact CN VII: upper and lower face symmetric CN VIII: hearing intact CN IX, X: gag intact, uvula midline CN XI: sternocleidomastoid and trapezius muscles intact CN XII: tongue  midline Bulk & Tone: normal, no fasciculations. Motor:  5/5 throughout *** Sensation:  Pinprick *** temperature *** and vibration sensation intact.  ***. Deep Tendon Reflexes:  2+ throughout, *** toes downgoing.  *** Finger to nose testing:  Without dysmetria.  *** Heel to shin:  Without dysmetria.  *** Gait:  Normal station and stride.  Able to turn and tandem walk. Romberg ***.  IMPRESSION: ***  PLAN: ***  Thank you for allowing me to take part in the care of this patient.  Metta Clines, DO  CC: ***

## 2018-12-17 ENCOUNTER — Ambulatory Visit: Payer: 59 | Admitting: Neurology

## 2018-12-17 ENCOUNTER — Encounter: Payer: Self-pay | Admitting: Neurology

## 2018-12-17 ENCOUNTER — Ambulatory Visit (INDEPENDENT_AMBULATORY_CARE_PROVIDER_SITE_OTHER): Payer: 59 | Admitting: Neurology

## 2018-12-17 VITALS — BP 150/90 | HR 79 | Ht 65.0 in | Wt 156.0 lb

## 2018-12-17 DIAGNOSIS — M0550 Rheumatoid polyneuropathy with rheumatoid arthritis of unspecified site: Secondary | ICD-10-CM | POA: Diagnosis not present

## 2018-12-17 DIAGNOSIS — R292 Abnormal reflex: Secondary | ICD-10-CM

## 2018-12-17 NOTE — Patient Instructions (Signed)

## 2018-12-17 NOTE — Progress Notes (Signed)
[facility] Clinic Note - Initial Visit   Date: 12/17/18  Pamela Pena MRN: [id number] DOB: [birth year]   Dear Dr. Renne Crigler:   Thank you for your kind referral of Pamela Pena for consultation of bilateral feet numbness. Although her history is well known to you, please allow [place] to reiterate it for the purpose of our medical record. The patient was accompanied to the clinic by self.    History of Present Illness: Pamela Pena is a 68 y.o. right-handed Caucasian female with rheumatoid arthritis presenting for evaluation of bilateral feet numbness.    Starting in December 2018, she began having numbness over the tips of the toes in both feet.  Over the year, the numbness has progressed to the balls of the feet.  She does not have burning, tingling or pain.  She has some irritable sensation when her bedsheets touch her toes.  No worsening of symptoms with constrictive shoes or temperature changes.  She denies any weakness or falls.  She does have some imbalance and is cautious when walking on uneven ground.  She suffered one mechanical fall in 2019 and did not sustain any injuries. She was evaluated by Dr. 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  She is here for second opinion.   Out-side paper records, electronic medical record, and images have been reviewed where available and summarized as:  Labs 02/25/2018:  HbA1c 5.2, thiamine 202, MMA 74, TSH 0.369, vitamin B12 1271, folate >20, celiac panel neg, heavy metal screen neg, SPEP no M protein    Past Medical History:  Diagnosis Date  . Disc degeneration, lumbar   . Piriformis syndrome    Fiberneuropathy  . RA (rheumatoid arthritis) (HCC)     Past Surgical History:  Procedure Laterality Date  . 25 GAUGE PARS PLANA VITRECTOMY WITH 20 GAUGE MVR PORT FOR MACULAR HOLE Right 10/01/2018   Procedure: 25  GAUGE PARS PLANA VITRECTOMY WITH 20 GAUGE MVR PORT RIGHT EYE ;  Surgeon: Sherrie George, MD;  Location: [facility] OR;  Service: Ophthalmology;  Laterality: Right;  . AIR/FLUID EXCHANGE Right 10/01/2018   Procedure: AIR/FLUID EXCHANGE RIGHT EYE;  Surgeon: Sherrie George, MD;  Location: [facility] OR;  Service: Ophthalmology;  Laterality: Right;  . CESAREAN SECTION  1970  . LASER PHOTO ABLATION Right 10/01/2018   Procedure: LASER PHOTO ABLATION RIGHT EYE;  Surgeon: Sherrie George, MD;  Location: [facility] OR;  Service: Ophthalmology;  Laterality: Right;  . MEMBRANE PEEL Right 10/01/2018   Procedure: MEMBRANE PEEL RIGHT EYE;  Surgeon: Sherrie George, MD;  Location: [facility] OR;  Service: Ophthalmology;  Laterality: Right;  . TONSILLECTOMY     age 68  . 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 TOTAL KNEE ARTHROPLASTY Left 07/16/2018   Procedure: LEFT TOTAL KNEE ARTHROPLASTY;  Surgeon: Durene Romans, MD;  Location: [facility] ORS;  Service: Orthopedics;  Laterality: Left;  Adductor Block     Medications:  Outpatient Encounter Medications as of 12/17/2018  Medication Sig Note  . acetaminophen (TYLENOL) 650 MG CR tablet Take 650 mg by mouth 2 (two) times daily.   . Ascorbic Acid (VITAMIN C) 1000 MG tablet Take 1,000 mg by mouth 2 (two) times daily.   . Biotin (BIOTIN 5000)  5 MG CAPS Take 5 mg by mouth daily.    . Calcium Carbonate (CALCIUM 600 PO) Take 600 mg by mouth 2 (two) times daily.   . Cholecalciferol (VITAMIN D3) 5000 units CAPS Take 5,000 Units by mouth daily.   . folic acid (FOLVITE) 400 MCG tablet Take 400 mcg by mouth daily.   Marland Kitchen HYDROcodone-acetaminophen (NORCO/VICODIN) 5-325 MG tablet Take 1 tablet by mouth every 6 (six) hours as needed (pain).    . LUTEIN-ZEAXANTHIN-BILBERRY PO Take 1 capsule by mouth daily. HERBAVISION 09/26/2018: On hold per patient  . Magnesium 500 MG CAPS  Take 500 mg by mouth every evening.    . Melatonin 3 MG CAPS Take 3 mg by mouth at bedtime.   . Menaquinone-7 (VITAMIN K2) 100 MCG CAPS Take 100 mcg by mouth 2 (two) times daily.   . methotrexate (RHEUMATREX) 2.5 MG tablet Take 1 tablet (2.5 mg total) by mouth 2 (two) times a week. Caution:Chemotherapy. Protect from light.  Takes 6 tablets per week , 3 tablets in the morning and 3 at HS   HOLD FOR 2 WEEKS AFTER SURGERY. (Patient taking differently: Take 2.5 mg by mouth See admin instructions. Caution:Chemotherapy. Protect from light.  Takes 6 tablets per week , 3 tablets (7.5 mg) in the morning on Mondays & take 3 tablets (7.5 mg) by mouth at bedtime on Mondays ONLY and 3 at HS   HOLD FOR 2 WEEKS AFTER SURGERY.)   . naproxen (NAPROSYN) 500 MG tablet Take 500 mg by mouth 2 (two) times daily with a meal.   . Omega-3 Fatty Acids (FISH OIL) 1000 MG CAPS Take 1,000 mg by mouth 2 (two) times daily.    . polyethylene glycol (MIRALAX / GLYCOLAX) packet Take 17 g by mouth 2 (two) times daily. (Patient taking differently: Take 17 g by mouth at bedtime. )   . predniSONE (DELTASONE) 5 MG tablet Take 5 mg by mouth daily.    . TURMERIC PO Take 1,800 mg by mouth 2 (two) times daily.   . vitamin E 400 UNIT capsule Take 400 Units by mouth daily.   Marland Kitchen zolpidem (AMBIEN) 5 MG tablet Take 5 mg by mouth at bedtime.    . [DISCONTINUED] bacitracin-polymyxin b (POLYSPORIN) ophthalmic ointment Place 1 application into the right eye 3 (three) times daily. apply to eye every 12 hours while awake   . [DISCONTINUED] celecoxib (CELEBREX) 200 MG capsule Take 1 capsule (200 mg total) by mouth 2 (two) times daily.   . [DISCONTINUED] docusate sodium (COLACE) 100 MG capsule Take 1 capsule (100 mg total) by mouth 2 (two) times daily.   . [DISCONTINUED] ferrous sulfate (FERROUSUL) 325 (65 FE) MG tablet Take 1 tablet (325 mg total) by mouth 3 (three) times daily with meals.   . [DISCONTINUED] gatifloxacin (ZYMAXID) 0.5 % SOLN Place  1 drop into the right eye 4 (four) times daily.   . [DISCONTINUED] methocarbamol (ROBAXIN) 500 MG tablet Take 500 mg by mouth every 8 (eight) hours as needed for muscle spasms.    . [DISCONTINUED] oxyCODONE (OXY IR/ROXICODONE) 5 MG immediate release tablet Take 1-2 tablets (5-10 mg total) by mouth every 4 (four) hours as needed for moderate pain or severe pain.   . [DISCONTINUED] prednisoLONE acetate (PRED FORTE) 1 % ophthalmic suspension Place 1 drop into the right eye 4 (four) times daily.   . [DISCONTINUED] promethazine (PHENERGAN) 12.5 MG tablet Take 1 tablet (12.5 mg total) by mouth every 6 (six) hours as needed for nausea or vomiting.  No facility-administered encounter medications on file as of 12/17/2018.      Allergies: No Known Allergies  Family History: Family History  Problem Relation Age of Onset  . Breast cancer Maternal Aunt   . CAD Mother   . Cancer Father   . COPD Sister   . Neuropathy Neg Hx     Social History: Social History   Tobacco Use  . Smoking status: Former Smoker    Years: 6.00    Last attempt to quit: 1974    Years since quitting: 46.0  . Smokeless tobacco: Never Used  Substance Use Topics  . Alcohol use: Not Currently    Comment: quit 1974  . Drug use: Not Currently    Comment: some use for 5 years after high school.    Social History   Social History Narrative   Lives at home. Her mother lives with her and she is her caretaker.  One story home.  She has one grown child in [place].    Works as an Print production planneroffice manager.  Education: high school.    Right handed   Drinks 2-4 cups of caffeine daily    Review of Systems:  CONSTITUTIONAL: No fevers, chills, night sweats, or weight loss.   EYES: No visual changes or eye pain ENT: No hearing changes.  No history of nose bleeds.   RESPIRATORY: No cough, wheezing and shortness of breath.   CARDIOVASCULAR: Negative for chest pain, and palpitations.   GI: Negative for abdominal discomfort, blood in  stools or black stools.  No recent change in bowel habits.   GU:  No history of incontinence.   MUSCLOSKELETAL: +history of joint pain or swelling.  No myalgias.   SKIN: Negative for lesions, rash, and itching.   HEMATOLOGY/ONCOLOGY: Negative for prolonged bleeding, bruising easily, and swollen nodes.  No history of cancer.   ENDOCRINE: Negative for cold or heat intolerance, polydipsia or goiter.   PSYCH:  No depression or anxiety symptoms.   NEURO: As Above.   Vital Signs:  BP (!) 150/90   Pulse 79   Ht 5\' 5"  (1.651 m)   Wt 156 lb (70.8 kg)   SpO2 98%   BMI 25.96 kg/m    General Medical Exam:   General:  Well appearing, comfortable.   Eyes/ENT: see cranial nerve examination.   Neck: No masses appreciated.  Full range of motion without tenderness.  No carotid bruits. Respiratory:  Clear to auscultation, good air entry bilaterally.   Cardiac:  Regular rate and rhythm, no murmur.   Extremities:  No deformities, edema, or skin discoloration.  Skin:  No rashes or lesions.  Neurological Exam: MENTAL STATUS including orientation to time, place, person, recent and remote memory, attention span and concentration, language, and fund of knowledge is normal.  Speech is not dysarthric.  CRANIAL NERVES: II:  No visual field defects.  Unremarkable fundi.   III-IV-VI: Pupils equal round and reactive to light.  Normal conjugate, extra-ocular eye movements in all directions of gaze.  No nystagmus.  No ptosis.   V:  Normal facial sensation.   VII:  Normal facial symmetry and movements.  VIII:  Normal hearing and vestibular function.   IX-X:  Normal palatal movement.   XI:  Normal shoulder shrug and head rotation.   XII:  Normal tongue strength and range of motion, no deviation or fasciculation.  MOTOR:  No atrophy, fasciculations or abnormal movements.  No pronator drift.  Tone is normal.    Right Upper Extremity:  Left Upper Extremity:    Deltoid  5/5   Deltoid  5/5   Biceps  5/5    Biceps  5/5   Triceps  5/5   Triceps  5/5   Wrist extensors  5/5   Wrist extensors  5/5   Wrist flexors  5/5   Wrist flexors  5/5   Finger extensors  5/5   Finger extensors  5/5   Finger flexors  5/5   Finger flexors  5/5   Dorsal interossei  5/5   Dorsal interossei  5/5   Abductor pollicis  5/5   Abductor pollicis  5/5   Tone (Ashworth scale)  0  Tone (Ashworth scale)  0   Right Lower Extremity:    Left Lower Extremity:    Hip flexors  5/5   Hip flexors  5/5   Hip extensors  5/5   Hip extensors  5/5   Knee flexors  5/5   Knee flexors  5/5   Knee extensors  5/5   Knee extensors  5/5   Dorsiflexors  5/5   Dorsiflexors  5/5   Plantarflexors  5/5   Plantarflexors  5/5   Toe extensors  5/5   Toe extensors  5/5   Toe flexors  5/5   Toe flexors  5/5   Tone (Ashworth scale)  0  Tone (Ashworth scale)  0   MSRs:  Right                                                                 Left brachioradialis 3+  brachioradialis 3+  biceps 3+  biceps 3+  triceps 3+  triceps 3+  patellar 3+  patellar 3+  ankle jerk 2+  ankle jerk 2+  Hoffman no  Hoffman no  plantar response down  plantar response down   SENSORY: Vibration is reduced to 50% at the ankles, 20% at the great toe and intact above the knees.  Temperature is reduced over the dorsum of the feet and pin prick diminished over the toes.  There is sway with Rhomberg testing.  COORDINATION/GAIT: Normal finger-to- nose-finger.  Intact rapid alternating movements bilaterally.   Gait narrow based and stable. She is unsteady with tandem gait.  Stressed gait intact.    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 I had extensive discussion with the patient regarding the pathogenesis, etiology, management, and natural course of neuropathy. Neuropathy tends to be slowly progressive.  Her RA is  well-controlled on MTX and corticosteroids.  Despite this, individuals with autoimmune disease can develop neuropathy.  She had extensive lab testing for treatable causes of neuropathy which was negative.  I will proceed with NCS/EMG of the legs to determine if there is any findings to suggestive polyradiculoneuropathy, but my overall suspicion is low.  I discussed that in the vast majority of cases management is symptomatic.  Fortunately, she is not experiencing pain.  For her imbalance, I discussed strategies to minimize risk of falls and recommend using a cane as needed especially on uneven ground.  She had many questions which were answered to the best of my ability.   She may also have  some degree of cervical canal stenosis as her exam shows generalized hyperreflexia.  She denies neck pain, arm weakness, or hand paresthesias.  Low threshold to image the spine, if she becomes symptomatic.    Thank you for allowing me to participate in patient's care.  If I can answer any additional questions, I would be pleased to do so.    Sincerely,     K. Allena Katz, DO

## 2019-01-10 ENCOUNTER — Encounter (INDEPENDENT_AMBULATORY_CARE_PROVIDER_SITE_OTHER): Payer: 59 | Admitting: Ophthalmology

## 2019-01-10 DIAGNOSIS — H43813 Vitreous degeneration, bilateral: Secondary | ICD-10-CM | POA: Diagnosis not present

## 2019-01-10 DIAGNOSIS — H35371 Puckering of macula, right eye: Secondary | ICD-10-CM | POA: Diagnosis not present

## 2019-01-10 DIAGNOSIS — H33303 Unspecified retinal break, bilateral: Secondary | ICD-10-CM | POA: Diagnosis not present

## 2019-04-08 ENCOUNTER — Encounter: Payer: 59 | Admitting: Neurology

## 2019-05-02 ENCOUNTER — Encounter: Payer: Self-pay | Admitting: Cardiology

## 2019-05-02 ENCOUNTER — Other Ambulatory Visit: Payer: Self-pay

## 2019-05-02 ENCOUNTER — Ambulatory Visit (INDEPENDENT_AMBULATORY_CARE_PROVIDER_SITE_OTHER): Payer: 59 | Admitting: Cardiology

## 2019-05-02 VITALS — BP 160/86 | HR 71 | Ht 65.0 in | Wt 159.9 lb

## 2019-05-02 DIAGNOSIS — R9431 Abnormal electrocardiogram [ECG] [EKG]: Secondary | ICD-10-CM

## 2019-05-02 DIAGNOSIS — Z01810 Encounter for preprocedural cardiovascular examination: Secondary | ICD-10-CM | POA: Insufficient documentation

## 2019-05-02 DIAGNOSIS — I491 Atrial premature depolarization: Secondary | ICD-10-CM | POA: Diagnosis not present

## 2019-05-02 NOTE — Progress Notes (Signed)
Patient referred by Deland Pretty, MD for abnormal EKG, perioperative risk stratification  Subjective:   Pamela Pena, female    DOB: 01/11/1951, 68 y.o.   MRN: [id number]   Chief Complaint  Patient presents with  . Abnormal ECG  . New Patient (Initial Visit)    HPI  68 y.o. Caucasian female with hyperlipidemia, rheumatoid arthritis, family history of CAD, peripheral neuropathy, referred for evaluation of abnormal EKG, preop workup.   Patient is planning to undergo right hip replacement on 05/20/2019 with Dr. Alvan Dame.  She was seen for preoperative for stratification by PCP Dr. Shelia Media.  EKG was noted to be abnormal with inverted P wave, this referred to [place] for further evaluation.  Patient denies any chest pain, shortness of breath for her limited level of activity.  She denies any palpitations, presyncope, syncope. Patient's baseline physical activity is limited due to her right hip pain.  She is scheduled to undergo right hip replacement on June 23.  At baseline, patient reports that her blood pressure is much lower than what is recorded today.  Multiple previous recordings, however, note high blood pressure.  Patient reports blood pressure checked at PCP visit was much lower.  Currently she is not on any antihypertensive therapy.  She does have hyperlipidemia, but does not want to be on statin.  She is currently using red yeast extract.   Past Medical History:  Diagnosis Date  . Disc degeneration, lumbar   . Piriformis syndrome    Fiberneuropathy  . RA (rheumatoid arthritis) ([place])      Past Surgical History:  Procedure Laterality Date  . [place] VITRECTOMY WITH 20 GAUGE MVR PORT FOR MACULAR HOLE Right 10/01/2018   Procedure: 25 GAUGE PARS PLANA VITRECTOMY WITH 20 GAUGE MVR PORT RIGHT EYE ;  Surgeon: Hayden Pedro, MD;  Location: [place];  Service: Ophthalmology;  Laterality: Right;  . AIR/FLUID EXCHANGE Right 10/01/2018   Procedure: AIR/FLUID EXCHANGE RIGHT EYE;   Surgeon: Hayden Pedro, MD;  Location: [place];  Service: Ophthalmology;  Laterality: Right;  . CESAREAN SECTION  1970  . LASER PHOTO ABLATION Right 10/01/2018   Procedure: LASER PHOTO ABLATION RIGHT EYE;  Surgeon: Hayden Pedro, MD;  Location: [place];  Service: Ophthalmology;  Laterality: Right;  . MEMBRANE PEEL Right 10/01/2018   Procedure: MEMBRANE PEEL RIGHT EYE;  Surgeon: Hayden Pedro, MD;  Location: [place];  Service: Ophthalmology;  Laterality: Right;  . TONSILLECTOMY     age 23  . TOTAL KNEE ARTHROPLASTY Right 06/10/2018   Procedure: TOTAL KNEE ARTHROPLASTY;  Surgeon: Paralee Cancel, MD;  Location: [facility];  Service: Orthopedics;  Laterality: Right;  90 mins  . TOTAL KNEE ARTHROPLASTY Left 07/16/2018   Procedure: LEFT TOTAL KNEE ARTHROPLASTY;  Surgeon: Paralee Cancel, MD;  Location: [facility];  Service: Orthopedics;  Laterality: Left;  Adductor Block     Social History   Socioeconomic History  . Marital status: Widowed    Spouse name: Not on file  . Number of children: 1  . Years of education: 38  . Highest education level: High school graduate  Occupational History  . Occupation: Glass blower/designer  Social Needs  . Financial resource strain: Not on file  . Food insecurity:    Worry: Not on file    Inability: Not on file  . Transportation needs:    Medical: Not on file    Non-medical: Not on file  Tobacco Use  . Smoking status: Former  Smoker    Years: 6.00    Last attempt to quit: 1974    Years since quitting: 46.4  . Smokeless tobacco: Never Used  Substance and Sexual Activity  . Alcohol use: Not Currently    Comment: quit 1974  . Drug use: Not Currently    Comment: some use for 5 years after high school.   . Sexual activity: Not on file  Lifestyle  . Physical activity:    Days per week: Not on file    Minutes per session: Not on file  . Stress: Not on file  Relationships  . Social connections:    Talks on phone: Not on file    Gets together: Not on file     Attends religious service: Not on file    Active member of club or organization: Not on file    Attends meetings of clubs or organizations: Not on file    Relationship status: Not on file  . Intimate partner violence:    Fear of current or ex partner: Not on file    Emotionally abused: Not on file    Physically abused: Not on file    Forced sexual activity: Not on file  Other Topics Concern  . Not on file  Social History Narrative   Lives at home. Her mother lives with her and she is her caretaker.  One story home.  She has one grown child in [place].    Works as an Glass blower/designer.  Education: high school.    Right handed   Drinks 2-4 cups of caffeine daily     Family History  Problem Relation Age of Onset  . Breast cancer Maternal Aunt   . CAD Mother   . Cancer Father   . COPD Sister   . Neuropathy Neg Hx      Current Outpatient Medications on File Prior to Visit  Medication Sig Dispense Refill  . acetaminophen (TYLENOL) 650 MG CR tablet Take 650 mg by mouth 2 (two) times daily.    . Ascorbic Acid (VITAMIN C) 1000 MG tablet Take 1,000 mg by mouth 2 (two) times daily.    . Biotin (BIOTIN 5000) 5 MG CAPS Take 5 mg by mouth daily.     . Calcium Carbonate (CALCIUM 600 PO) Take 600 mg by mouth 2 (two) times daily.    . Cholecalciferol (VITAMIN D3) 5000 units CAPS Take 5,000 Units by mouth daily.    . folic acid (FOLVITE) 409 MCG tablet Take 400 mcg by mouth daily.    Marland Kitchen HYDROcodone-acetaminophen (NORCO/VICODIN) 5-325 MG tablet Take 1 tablet by mouth every 6 (six) hours as needed (pain).     . LUTEIN-ZEAXANTHIN-BILBERRY PO Take 1 capsule by mouth daily. HERBAVISION    . Magnesium 500 MG CAPS Take 500 mg by mouth every evening.     . Melatonin 3 MG CAPS Take 3 mg by mouth at bedtime.    . Menaquinone-7 (VITAMIN K2) 100 MCG CAPS Take 100 mcg by mouth 2 (two) times daily.    . methotrexate (RHEUMATREX) 2.5 MG tablet Take 1 tablet (2.5 mg total) by mouth 2 (two) times a week.  Caution:Chemotherapy. Protect from light.  Takes 6 tablets per week , 3 tablets in the morning and 3 at HS   HOLD FOR 2 WEEKS AFTER SURGERY. (Patient taking differently: Take 2.5 mg by mouth See admin instructions. Caution:Chemotherapy. Protect from light.  Takes 6 tablets per week , 3 tablets (7.5 mg) in the morning on Mondays &  take 3 tablets (7.5 mg) by mouth at bedtime on Mondays ONLY and 3 at [place].) 4 tablet 0  . naproxen (NAPROSYN) 500 MG tablet Take 500 mg by mouth 2 (two) times daily with a meal.    . Omega-3 Fatty Acids (FISH OIL) 1000 MG CAPS Take 1,000 mg by mouth 2 (two) times daily.     . polyethylene glycol (MIRALAX / GLYCOLAX) packet Take 17 g by mouth 2 (two) times daily. (Patient taking differently: Take 17 g by mouth at bedtime. ) 14 each 0  . predniSONE (DELTASONE) 5 MG tablet Take 5 mg by mouth daily.     . TURMERIC PO Take 1,800 mg by mouth 2 (two) times daily.    . vitamin E 400 UNIT capsule Take 400 Units by mouth daily.    Marland Kitchen zolpidem (AMBIEN) 5 MG tablet Take 5 mg by mouth at bedtime.      No current facility-administered medications on file prior to visit.     Cardiovascular studies:  EKG 05/02/2019: Ectopic atrial rhythm 67 bpm. 1:1 conduction. No change compared to previous EKG on 04/29/2019  EKG 04/29/2019: Ectopic atrial rhythm.  Otherwise normal EKG  Recent labs: Glucose 89. BUN/Cr 21/0.81. eGFR 74. Na/K 140/4.5 H/H 13/42. MCV 102. Platelets 224. Chol 284, TG 54, HDL 94, LDL 179.  HbA1C 5.2%   Review of Systems  Constitution: Negative for decreased appetite, malaise/fatigue, weight gain and weight loss.  HENT: Negative for congestion.   Eyes: Negative for visual disturbance.  Cardiovascular: Negative for chest pain, dyspnea on exertion, leg swelling, palpitations and syncope.  Respiratory: Negative for cough and shortness of breath.   Endocrine: Negative for cold intolerance.  Hematologic/Lymphatic: Does not  bruise/bleed easily.  Skin: Negative for itching and rash.  Musculoskeletal: Positive for arthritis and joint pain. Negative for myalgias.  Gastrointestinal: Negative for abdominal pain, nausea and vomiting.  Genitourinary: Negative for dysuria.  Neurological: Negative for dizziness and weakness.  Psychiatric/Behavioral: The patient is not nervous/anxious.   All other systems reviewed and are negative.        Vitals:   05/02/19 1118  BP: (!) 160/86  Pulse: 71  SpO2: 100%     Body mass index is 26.61 kg/m. Filed Weights   05/02/19 1118  Weight: 159 lb 14.4 oz (72.5 kg)     Objective:   Physical Exam  Constitutional: She is oriented to person, place, and time. She appears well-developed and well-nourished. No distress.  HENT:  Head: Normocephalic and atraumatic.  Eyes: Pupils are equal, round, and reactive to light. Conjunctivae are normal.  Neck: No JVD present.  Cardiovascular: Normal rate, regular rhythm and intact distal pulses.  Pulmonary/Chest: Effort normal and breath sounds normal. She has no wheezes. She has no rales.  Abdominal: Soft. Bowel sounds are normal. There is no rebound.  Musculoskeletal:        General: No edema.  Lymphadenopathy:    She has no cervical adenopathy.  Neurological: She is alert and oriented to person, place, and time. No cranial nerve deficit.  Skin: Skin is warm and dry.  Psychiatric: She has a normal mood and affect.  Nursing note and vitals reviewed.         Assessment & Recommendations:   68 y.o. Caucasian female with hyperlipidemia, rheumatoid arthritis, family history of CAD, peripheral neuropathy, referred for evaluation of abnormal EKG, preop workup.    Abnormal EKG: Ectopic atrial rhythm as an incidental finding.  Patient does not have any symptoms related to this.  Physical exam is unremarkable.  I do not think she necessarily needs any other work-up regarding this.  Preop work-up: Patient does have risk factors  for CAD including family history of CAD, hypertension and hyperlipidemia.  She is not on any treatment for this, at patient's choice.  Her baseline functional capacity is limited due to her hip pain, thus unable to assess.  Recommend Lexiscan nuclear stress test as part of preop work-up.  Hyperlipidemia: Not on statin, as per patient's wishes.  I will see he ron as need basis, unless any significant abnormalities found on stress test.   Thank you for referring the patient to [place]. Please feel free to contact with any questions.  Nigel Mormon, MD [facility] [place] Pager: [phone] Office: [phone] If no answer Cell [phone]

## 2019-05-06 ENCOUNTER — Telehealth: Payer: Self-pay | Admitting: Cardiology

## 2019-05-06 NOTE — Telephone Encounter (Signed)
Blood pressure readings reviewed. Reasonable range. No changes necessary at this time.  Thanks MJP

## 2019-05-07 ENCOUNTER — Ambulatory Visit: Payer: Self-pay | Admitting: Cardiology

## 2019-05-07 NOTE — H&P (Signed)
TOTAL HIP ADMISSION H&P  Patient is admitted for right total hip arthroplasty, anterior approach.  Subjective:  Chief Complaint:   Right hip primary OA / pain  HPI: Pamela Pena, 68 y.o. female, has a history of pain and functional disability in the right hip(s) due to arthritis and patient has failed non-surgical conservative treatments for greater than 12 weeks to include NSAID's and/or analgesics, corticosteriod injections and activity modification.  Onset of symptoms was gradual starting 1+ years ago with gradually worsening course since that time.The patient noted no past surgery on the right hip(s).  Patient currently rates pain in the right hip at 9 out of 10 with activity. Patient has night pain, worsening of pain with activity and weight bearing, trendelenberg gait, pain that interfers with activities of daily living and pain with passive range of motion. Patient has evidence of periarticular osteophytes and joint space narrowing by imaging studies. This condition presents safety issues increasing the risk of falls.   There is no current active infection. Risks, benefits and expectations were discussed with the patient.  Risks including but not limited to the risk of anesthesia, blood clots, nerve damage, blood vessel damage, failure of the prosthesis, infection and up to and including death.  Patient understand the risks, benefits and expectations and wishes to proceed with surgery.   PCP: Merri Brunette, MD  D/C Plans:       Home  Post-op Meds:       No Rx given   Tranexamic Acid:      To be given - IV   Decadron:      Is to be given  FYI:      ASA  Norco  DME:   Pt already has equipment   PT:   HEP  Pharmacy: [facility] [address]    Patient Active Problem List   Diagnosis Date Noted  . Nonspecific abnormal electrocardiogram (ECG) (EKG) 05/02/2019  . Preop cardiovascular exam 05/02/2019  . 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 Overweight  (BMI 25.0-29.9) 07/17/2018  . 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 Small fiber neuropathy 02/25/2018   Past Medical History:  Diagnosis Date  . Disc degeneration, lumbar   . Piriformis syndrome    Fiberneuropathy  . RA (rheumatoid arthritis) (HCC)     Past Surgical History:  Procedure Laterality Date  . 25 GAUGE PARS PLANA VITRECTOMY WITH 20 GAUGE MVR PORT FOR MACULAR HOLE Right 10/01/2018   Procedure: 25 GAUGE PARS PLANA VITRECTOMY WITH 20 GAUGE MVR PORT RIGHT EYE ;  Surgeon: Sherrie George, MD;  Location: [facility] OR;  Service: Ophthalmology;  Laterality: Right;  . AIR/FLUID EXCHANGE Right 10/01/2018   Procedure: AIR/FLUID EXCHANGE RIGHT EYE;  Surgeon: Sherrie George, MD;  Location: [facility] OR;  Service: Ophthalmology;  Laterality: Right;  . CESAREAN SECTION  1970  . LASER PHOTO ABLATION Right 10/01/2018   Procedure: LASER PHOTO ABLATION RIGHT EYE;  Surgeon: Sherrie George, MD;  Location: [facility] OR;  Service: Ophthalmology;  Laterality: Right;  . MEMBRANE PEEL Right 10/01/2018   Procedure: MEMBRANE PEEL RIGHT EYE;  Surgeon: Sherrie George, MD;  Location: [facility] OR;  Service: Ophthalmology;  Laterality: Right;  . TONSILLECTOMY     age 61  . 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 TOTAL KNEE  ARTHROPLASTY Left 07/16/2018   Procedure: LEFT TOTAL KNEE ARTHROPLASTY;  Surgeon: Durene Romans, MD;  Location: [facility] ORS;  Service: Orthopedics;  Laterality: Left;  Adductor Block    No current facility-administered medications for this encounter.    Current Outpatient Medications  Medication Sig Dispense Refill Last Dose  . acetaminophen (TYLENOL) 650 MG CR tablet Take 650 mg by mouth 2 (two) times daily.   Taking  . Ascorbic Acid (VITAMIN C) 1000 MG tablet Take 1,000 mg by mouth 2 (two) times daily.   Taking  . Biotin (BIOTIN 5000) 5 MG CAPS Take 5 mg  by mouth daily.    Taking  . Calcium Carbonate (CALCIUM 600 PO) Take 600 mg by mouth 2 (two) times daily.   Taking  . Cholecalciferol (VITAMIN D3) 5000 units CAPS Take 5,000 Units by mouth daily.   Taking  . folic acid (FOLVITE) 400 MCG tablet Take 400 mcg by mouth daily.   Taking  . HYDROcodone-acetaminophen (NORCO/VICODIN) 5-325 MG tablet Take 1 tablet by mouth every 6 (six) hours as needed (pain).    Taking  . LUTEIN-ZEAXANTHIN-BILBERRY PO Take 1 capsule by mouth daily. HERBAVISION   Taking  . Magnesium 500 MG CAPS Take 500 mg by mouth every evening.    Taking  . Melatonin 3 MG CAPS Take 3 mg by mouth at bedtime.   Taking  . Menaquinone-7 (VITAMIN K2) 100 MCG CAPS Take 100 mcg by mouth 2 (two) times daily.   Taking  . methotrexate (RHEUMATREX) 2.5 MG tablet Take 1 tablet (2.5 mg total) by mouth 2 (two) times a week. Caution:Chemotherapy. Protect from light.  Takes 6 tablets per week , 3 tablets in the morning and 3 at HS   HOLD FOR 2 WEEKS AFTER SURGERY. (Patient taking differently: Take 2.5 mg by mouth See admin instructions. Caution:Chemotherapy. Protect from light.  Takes 6 tablets per week , 3 tablets (7.5 mg) in the morning on Mondays & take 3 tablets (7.5 mg) by mouth at bedtime on Mondays ONLY and 3 at HS   HOLD FOR 2 WEEKS AFTER SURGERY.) 4 tablet 0 Taking  . naproxen (NAPROSYN) 500 MG tablet Take 500 mg by mouth 2 (two) times daily with a meal.   Taking  . Omega-3 Fatty Acids (FISH OIL) 1000 MG CAPS Take 1,000 mg by mouth 2 (two) times daily.    Taking  . polyethylene glycol (MIRALAX / GLYCOLAX) packet Take 17 g by mouth 2 (two) times daily. (Patient taking differently: Take 17 g by mouth at bedtime. ) 14 each 0 Taking  . predniSONE (DELTASONE) 5 MG tablet Take 5 mg by mouth daily.    Taking  . TURMERIC PO Take 1,800 mg by mouth 2 (two) times daily.   Taking  . vitamin E 400 UNIT capsule Take 400 Units by mouth daily.   Taking  . zolpidem (AMBIEN) 5 MG tablet Take 5 mg by mouth at  bedtime.    Taking   No Known Allergies  Social History   Tobacco Use  . Smoking status: Former Smoker    Packs/day: 1.00    Years: 6.00    Pack years: 6.00    Types: Cigarettes    Last attempt to quit: 1974    Years since quitting: 46.4  . Smokeless tobacco: Never Used  Substance Use Topics  . Alcohol use: Not Currently    Comment: quit 1974    Family History  Problem Relation Age of Onset  . Breast cancer Maternal Aunt   .  CAD Mother   . Cancer Father   . COPD Sister   . Neuropathy Neg Hx      Review of Systems  Constitutional: Negative.   HENT: Negative.   Eyes: Negative.   Respiratory: Negative.   Cardiovascular: Negative.   Gastrointestinal: Negative.   Genitourinary: Negative.   Musculoskeletal: Positive for joint pain.  Skin: Negative.   Neurological: Negative.   Endo/Heme/Allergies: Negative.   Psychiatric/Behavioral: Negative.     Objective:  Physical Exam  Constitutional: She is oriented to person, place, and time. She appears well-developed.  HENT:  Head: Normocephalic.  Eyes: Pupils are equal, round, and reactive to light.  Neck: Neck supple. No JVD present. No tracheal deviation present. No thyromegaly present.  Cardiovascular: Normal rate, regular rhythm and intact distal pulses.  Respiratory: Effort normal and breath sounds normal. No respiratory distress. She has no wheezes.  GI: Soft. There is no abdominal tenderness. There is no guarding.  Musculoskeletal:     Right hip: She exhibits decreased range of motion, decreased strength, tenderness and bony tenderness. She exhibits no swelling, no deformity and no laceration.  Lymphadenopathy:    She has no cervical adenopathy.  Neurological: She is alert and oriented to person, place, and time. A sensory deficit (bil LE neuropathy) is present.  Skin: Skin is warm and dry.  Psychiatric: She has a normal mood and affect.     Labs:  Estimated body mass index is 26.61 kg/m as calculated from  the following:   Height as of 05/02/19: 5\' 5"  (1.651 m).   Weight as of 05/02/19: 72.5 kg.   Imaging Review Plain radiographs demonstrate severe degenerative joint disease of the right hip. The bone quality appears to be good for age and reported activity level.      Assessment/Plan:  End stage arthritis, right hip  The patient history, physical examination, clinical judgement of the provider and imaging studies are consistent with end stage degenerative joint disease of the right hip and total hip arthroplasty is deemed medically necessary. The treatment options including medical management, injection therapy, arthroscopy and arthroplasty were discussed at length. The risks and benefits of total hip arthroplasty were presented and reviewed. The risks due to aseptic loosening, infection, stiffness, dislocation/subluxation,  thromboembolic complications and other imponderables were discussed.  The patient acknowledged the explanation, agreed to proceed with the plan and consent was signed. Patient is being admitted for inpatient treatment for surgery, pain control, PT, OT, prophylactic antibiotics, VTE prophylaxis, progressive ambulation and ADL's and discharge planning.The patient is planning to be discharged home.   [place] Kalisha Keadle   PA-C  05/07/2019, 10:38 AM

## 2019-05-12 ENCOUNTER — Ambulatory Visit (INDEPENDENT_AMBULATORY_CARE_PROVIDER_SITE_OTHER): Payer: 59

## 2019-05-12 ENCOUNTER — Other Ambulatory Visit: Payer: Self-pay

## 2019-05-12 DIAGNOSIS — Z01810 Encounter for preprocedural cardiovascular examination: Secondary | ICD-10-CM | POA: Diagnosis not present

## 2019-05-12 NOTE — Patient Instructions (Addendum)
TEA COLLUMS    Your procedure is scheduled on: 05-20-2019   Report to [facility] Main  Entrance   Report to Admitting at 915 AM   YOU NEED TO HAVE A COVID 19 TEST ON 05-16-19 @ 3:30 pm_______, THIS TEST MUST BE DONE BEFORE SURGERY, COME TO [facility] ENTRANCE. ONCE YOUR COVID TEST IS COMPLETED, PLEASE BEGIN THE QUARANTINE INSTRUCTIONS AS OUTLINED IN YOUR HANDOUT.   Call this number if you have problems the morning of surgery [phone]    Remember:  NO SOLID FOOD AFTER MIDNIGHT THE NIGHT PRIOR TO SURGERY. NOTHING BY MOUTH EXCEPT CLEAR LIQUIDS UNTIL 430 AM.  PLEASE FINISH ENSURE DRINK PER SURGEON ORDER WHICH NEEDS TO BE COMPLETED AT 430 AM.     CLEAR LIQUID DIET   Foods Allowed                                                                     Foods Excluded  Coffee and tea, regular and decaf                             liquids that you cannot  Plain Jell-O in any flavor                                             see through such as: Fruit ices (not with fruit pulp)                                     milk, soups, orange juice  Iced Popsicles                                    All solid food Carbonated beverages, regular and diet                                    Cranberry, grape and apple juices Sports drinks like Gatorade Lightly seasoned clear broth or consume(fat free) Sugar, honey syrup  Sample Menu Breakfast                                Lunch                                     Supper Cranberry juice                    Beef broth                            Chicken broth Jell-O  Grape juice                           Apple juice Coffee or tea                        Jell-O                                      Popsicle                                                Coffee or tea                        Coffee or  tea  _____________________________________________________________________    Take these medicines the morning of surgery with A SIP OF WATER: None  BRUSH YOUR TEETH MORNING OF SURGERY AND RINSE YOUR MOUTH OUT, NO CHEWING GUM CANDY OR MINTS.               You may not have any metal on your body including hair pins and              piercings     Do not wear jewelry, make-up, lotions, powders or perfumes, deodorant             Do not wear nail polish.  Do not shave  48 hours prior to surgery.              Do not bring valuables to the hospital. [place].  Contacts, dentures or bridgework may not be worn into surgery.        _____________________________________________________________________             [facility] - Preparing for Surgery Before surgery, you can play an important role.  Because skin is not sterile, your skin needs to be as free of germs as possible.  You can reduce the number of germs on your skin by washing with CHG (chlorahexidine gluconate) soap before surgery.  CHG is an antiseptic cleaner which kills germs and bonds with the skin to continue killing germs even after washing. Please DO NOT use if you have an allergy to CHG or antibacterial soaps.  If your skin becomes reddened/irritated stop using the CHG and inform your nurse when you arrive at Short Stay. Do not shave (including legs and underarms) for at least 48 hours prior to the first CHG shower.  You may shave your face/neck. Please follow these instructions carefully:  1.  Shower with CHG Soap the night before surgery and the  morning of Surgery.  2.  If you choose to wash your hair, wash your hair first as usual with your  normal  shampoo.  3.  After you shampoo, rinse your hair and body thoroughly to remove the  shampoo.                           4.  Use CHG as you would any other liquid soap.  You can apply chg directly  to the skin  and wash                        Gently with a scrungie or clean washcloth.  5.  Apply the CHG Soap to your body ONLY FROM THE NECK DOWN.   Do not use on face/ open                           Wound or open sores. Avoid contact with eyes, ears mouth and genitals (private parts).                       Wash face,  Genitals (private parts) with your normal soap.             6.  Wash thoroughly, paying special attention to the area where your surgery  will be performed.  7.  Thoroughly rinse your body with warm water from the neck down.  8.  DO NOT shower/wash with your normal soap after using and rinsing off  the CHG Soap.                9.  Pat yourself dry with a clean towel.            10.  Wear clean pajamas.            11.  Place clean sheets on your bed the night of your first shower and do not  sleep with pets. Day of Surgery : Do not apply any lotions/deodorants the morning of surgery.  Please wear clean clothes to the hospital/surgery center.  FAILURE TO FOLLOW THESE INSTRUCTIONS MAY RESULT IN THE CANCELLATION OF YOUR SURGERY PATIENT SIGNATURE_________________________________  NURSE SIGNATURE__________________________________  ________________________________________________________________________   Rogelia Mire  An incentive spirometer is a tool that can help keep your lungs clear and active. This tool measures how well you are filling your lungs with each breath. Taking long deep breaths may help reverse or decrease the chance of developing breathing (pulmonary) problems (especially infection) following:  A long period of time when you are unable to move or be active. BEFORE THE PROCEDURE   If the spirometer includes an indicator to show your best effort, your nurse or respiratory therapist will set it to a desired goal.  If possible, sit up straight or lean slightly forward. Try not to slouch.  Hold the incentive spirometer in an upright position. INSTRUCTIONS FOR USE  1. Sit on the edge  of your bed if possible, or sit up as far as you can in bed or on a chair. 2. Hold the incentive spirometer in an upright position. 3. Breathe out normally. 4. Place the mouthpiece in your mouth and seal your lips tightly around it. 5. Breathe in slowly and as deeply as possible, raising the piston or the ball toward the top of the column. 6. Hold your breath for 3-5 seconds or for as long as possible. Allow the piston or ball to fall to the bottom of the column. 7. Remove the mouthpiece from your mouth and breathe out normally. 8. Rest for a few seconds and repeat Steps 1 through 7 at least 10 times every 1-2 hours when you are awake. Take your time and take a few normal breaths between deep breaths. 9. The spirometer may include an indicator to show your best effort. Use the indicator as a goal to work toward during each repetition. 10. After each set of  10 deep breaths, practice coughing to be sure your lungs are clear. If you have an incision (the cut made at the time of surgery), support your incision when coughing by placing a pillow or rolled up towels firmly against it. Once you are able to get out of bed, walk around indoors and cough well. You may stop using the incentive spirometer when instructed by your caregiver.  RISKS AND COMPLICATIONS  Take your time so you do not get dizzy or light-headed.  If you are in pain, you may need to take or ask for pain medication before doing incentive spirometry. It is harder to take a deep breath if you are having pain. AFTER USE  Rest and breathe slowly and easily.  It can be helpful to keep track of a log of your progress. Your caregiver can provide you with a simple table to help with this. If you are using the spirometer at home, follow these instructions: SEEK MEDICAL CARE IF:   You are having difficultly using the spirometer.  You have trouble using the spirometer as often as instructed.  Your pain medication is not giving enough  relief while using the spirometer.  You develop fever of 100.5 F (38.1 C) or higher. SEEK IMMEDIATE MEDICAL CARE IF:   You cough up bloody sputum that had not been present before.  You develop fever of 102 F (38.9 C) or greater.  You develop worsening pain at or near the incision site. MAKE SURE YOU:   Understand these instructions.  Will watch your condition.  Will get help right away if you are not doing well or get worse. Document Released: 03/26/2007 Document Revised: 02/05/2012 Document Reviewed: 05/27/2007 ExitCare Patient Information 2014 [employer], [place].   ________________________________________________________________________  WHAT IS A BLOOD TRANSFUSION? Blood Transfusion Information  A transfusion is the replacement of blood or some of its parts. Blood is made up of multiple cells which provide different functions.  Red blood cells carry oxygen and are used for blood loss replacement.  White blood cells fight against infection.  Platelets control bleeding.  Plasma helps clot blood.  Other blood products are available for specialized needs, such as hemophilia or other clotting disorders. BEFORE THE TRANSFUSION  Who gives blood for transfusions?   Healthy volunteers who are fully evaluated to make sure their blood is safe. This is blood bank blood. Transfusion therapy is the safest it has ever been in the practice of medicine. Before blood is taken from a donor, a complete history is taken to make sure that person has no history of diseases nor engages in risky social behavior (examples are intravenous drug use or sexual activity with multiple partners). The donor's travel history is screened to minimize risk of transmitting infections, such as malaria. The donated blood is tested for signs of infectious diseases, such as HIV and hepatitis. The blood is then tested to be sure it is compatible with you in order to minimize the chance of a transfusion reaction. If  you or a relative donates blood, this is often done in anticipation of surgery and is not appropriate for emergency situations. It takes many days to process the donated blood. RISKS AND COMPLICATIONS Although transfusion therapy is very safe and saves many lives, the main dangers of transfusion include:   Getting an infectious disease.  Developing a transfusion reaction. This is an allergic reaction to something in the blood you were given. Every precaution is taken to prevent this. The decision to have a blood  transfusion has been considered carefully by your caregiver before blood is given. Blood is not given unless the benefits outweigh the risks. AFTER THE TRANSFUSION  Right after receiving a blood transfusion, you will usually feel much better and more energetic. This is especially true if your red blood cells have gotten low (anemic). The transfusion raises the level of the red blood cells which carry oxygen, and this usually causes an energy increase.  The nurse administering the transfusion will monitor you carefully for complications. HOME CARE INSTRUCTIONS  No special instructions are needed after a transfusion. You may find your energy is better. Speak with your caregiver about any limitations on activity for underlying diseases you may have. SEEK MEDICAL CARE IF:   Your condition is not improving after your transfusion.  You develop redness or irritation at the intravenous (IV) site. SEEK IMMEDIATE MEDICAL CARE IF:  Any of the following symptoms occur over the next 12 hours:  Shaking chills.  You have a temperature by mouth above 102 F (38.9 C), not controlled by medicine.  Chest, back, or muscle pain.  People around you feel you are not acting correctly or are confused.  Shortness of breath or difficulty breathing.  Dizziness and fainting.  You get a rash or develop hives.  You have a decrease in urine output.  Your urine turns a dark color or changes to pink,  red, or brown. Any of the following symptoms occur over the next 10 days:  You have a temperature by mouth above 102 F (38.9 C), not controlled by medicine.  Shortness of breath.  Weakness after normal activity.  The white part of the eye turns yellow (jaundice).  You have a decrease in the amount of urine or are urinating less often.  Your urine turns a dark color or changes to pink, red, or brown. Document Released: 11/10/2000 Document Revised: 02/05/2012 Document Reviewed: 06/29/2008 [facility] Patient Information 2014 [place], [place].  _______________________________________________________________________

## 2019-05-12 NOTE — Progress Notes (Signed)
LOC [facility] FOR ABNORMAL EKG 05-02-19 Epic EKG 05-02-19 Epic PATIENT SCHEDULED FOR MYOCARDIAL PERFUSION WITH LEXISCAN 05-12-19

## 2019-05-13 ENCOUNTER — Other Ambulatory Visit: Payer: Self-pay

## 2019-05-13 ENCOUNTER — Encounter (HOSPITAL_COMMUNITY): Payer: Self-pay

## 2019-05-13 ENCOUNTER — Encounter (HOSPITAL_COMMUNITY)
Admission: RE | Admit: 2019-05-13 | Discharge: 2019-05-13 | Disposition: A | Discharge status: Home / Self Care | Payer: 59 | Source: Ambulatory Visit | Attending: Orthopedic Surgery | Admitting: Orthopedic Surgery

## 2019-05-13 DIAGNOSIS — Z01812 Encounter for preprocedural laboratory examination: Secondary | ICD-10-CM | POA: Diagnosis present

## 2019-05-13 DIAGNOSIS — M1611 Unilateral primary osteoarthritis, right hip: Secondary | ICD-10-CM | POA: Diagnosis not present

## 2019-05-13 LAB — BASIC METABOLIC PANEL
Anion gap: 12 (ref 5–15)
BUN: 17 mg/dL (ref 8–23)
CO2: 29 mmol/L (ref 22–32)
Calcium: 9.3 mg/dL (ref 8.9–10.3)
Chloride: 100 mmol/L (ref 98–111)
Creatinine, Ser: 0.65 mg/dL (ref 0.44–1.00)
GFR calc Af Amer: >60 mL/min (ref >60)
GFR calc non Af Amer: >60 mL/min (ref >60)
Glucose, Bld: 82 mg/dL (ref 70–99)
Potassium: 4.6 mmol/L (ref 3.5–5.1)
Sodium: 141 mmol/L (ref 135–145)

## 2019-05-13 LAB — CBC
HCT: 40.1 % (ref 36.0–46.0)
Hemoglobin: 13 g/dL (ref 12.0–15.0)
MCH: 34 pg (ref 26.0–34.0)
MCHC: 32.4 g/dL (ref 30.0–36.0)
MCV: 105 fL — ABNORMAL HIGH (ref 80.0–100.0)
Platelets: 262 10*3/uL (ref 150–400)
RBC: 3.82 MIL/uL — ABNORMAL LOW (ref 3.87–5.11)
RDW: 13.9 % (ref 11.5–15.5)
WBC: 7 10*3/uL (ref 4.0–10.5)
nRBC: 0 % (ref 0.0–0.2)

## 2019-05-13 LAB — SURGICAL PCR SCREEN
MRSA, PCR: NEGATIVE
Staphylococcus aureus: NEGATIVE

## 2019-05-13 NOTE — Progress Notes (Signed)
05-13-19 (Epic) Clearance from Dr. Virgina Jock in Stress Test Result

## 2019-05-13 NOTE — Progress Notes (Signed)
Stress test shows no abnormalities. Okay to proceed with scheduled surgery.  Thanks MJP

## 2019-05-14 NOTE — Progress Notes (Signed)
S/w patient advised her of normal stress test.

## 2019-05-14 NOTE — Anesthesia Preprocedure Evaluation (Addendum)
Anesthesia Evaluation  Patient identified by MRN, date of birth, ID band Patient awake    Reviewed: Allergy & Precautions, NPO status , Patient's Chart, lab work & pertinent test results  Airway Mallampati: II  TM Distance: >3 FB Neck ROM: Full    Dental no notable dental hx.    Pulmonary neg pulmonary ROS, former smoker,    Pulmonary exam normal breath sounds clear to auscultation       Cardiovascular negative cardio ROS Normal cardiovascular exam Rhythm:Regular Rate:Normal     Neuro/Psych negative neurological ROS  negative psych ROS   GI/Hepatic negative GI ROS, Neg liver ROS,   Endo/Other  negative endocrine ROS  Renal/GU negative Renal ROS  negative genitourinary   Musculoskeletal  (+) Arthritis , Rheumatoid disorders,    Abdominal   Peds negative pediatric ROS (+)  Hematology negative hematology ROS (+)   Anesthesia Other Findings   Reproductive/Obstetrics negative OB ROS                            Anesthesia Physical Anesthesia Plan  ASA: II  Anesthesia Plan: Spinal   Post-op Pain Management:    Induction: Intravenous  PONV Risk Score and Plan: 2 and Ondansetron, Midazolam and Treatment may vary due to age or medical condition  Airway Management Planned: Simple Face Mask  Additional Equipment:   Intra-op Plan:   Post-operative Plan:   Informed Consent: I have reviewed the patients History and Physical, chart, labs and discussed the procedure including the risks, benefits and alternatives for the proposed anesthesia with the patient or authorized representative who has indicated his/her understanding and acceptance.     Dental advisory given  Plan Discussed with: CRNA  Anesthesia Plan Comments: (See PAT note 05/13/2019, Konrad Felix, PA-C)       Anesthesia Quick Evaluation

## 2019-05-14 NOTE — Progress Notes (Signed)
Anesthesia Chart Review   Case: [id number] Date/Time: 05/20/19 1130   Procedure: TOTAL HIP ARTHROPLASTY ANTERIOR APPROACH (Right ) - 70 mins   Anesthesia type: Spinal   Pre-op diagnosis: Right hip osteoarthritis   Location: [facility] ROOM 10 / [facility]   Surgeon: Paralee Cancel, MD      DISCUSSION: 68 yo former smoker (6 pack years) with h/o RA, right hip OA scheduled for above procedure 05/20/2019 with Dr. Paralee Cancel.   Pt last seen by Cardiologist, Dr. Vernell Leep, for preoperative evaluation on 05/02/2019.  Per OV note, "Patient does have risk factors for CAD including family history of CAD, hypertension and hyperlipidemia.  She is not on any treatment for this, at patient's choice.  Her baseline functional capacity is limited due to her hip pain, thus unable to assess.  Recommend Lexiscan nuclear stress test as part of preop work-up."  Stress test completed with no abnormalities.  Per Dr. Virgina Jock, "Okay to proceed with scheduled surgery."  Pt can proceed with planned procedure barring acute status change.   VS: BP 129/68   Pulse 73   Temp 37 C (Oral)   Resp 18   Ht 5\' 5"  (1.651 m)   Wt 71.6 kg   SpO2 100%   BMI 26.26 kg/m   PROVIDERS: Deland Pretty, MD is PCP   Vernell Leep, MD is Cardiologist  LABS: Labs reviewed: Acceptable for surgery. (all labs ordered are listed, but only abnormal results are displayed)  Labs Reviewed  CBC - Abnormal; Notable for the following components:      Result Value   RBC 3.82 (*)    MCV 105.0 (*)    All other components within normal limits  SURGICAL PCR SCREEN  BASIC METABOLIC PANEL  TYPE AND SCREEN     IMAGES:   EKG: 05/02/2019 Rate 67 bpm Ectopic atrial rhythm 67 bpm 1:1 conduction  No change compared to previous EKG 04/29/2019  CV:  Past Medical History:  Diagnosis Date  . Disc degeneration, lumbar   . Piriformis syndrome    Fiberneuropathy  . RA (rheumatoid arthritis) ([place])     Past Surgical History:  Procedure  Laterality Date  . [place] VITRECTOMY WITH 20 GAUGE MVR PORT FOR MACULAR HOLE Right 10/01/2018   Procedure: 25 GAUGE PARS PLANA VITRECTOMY WITH 20 GAUGE MVR PORT RIGHT EYE ;  Surgeon: Hayden Pedro, MD;  Location: [facility];  Service: Ophthalmology;  Laterality: Right;  . AIR/FLUID EXCHANGE Right 10/01/2018   Procedure: AIR/FLUID EXCHANGE RIGHT EYE;  Surgeon: Hayden Pedro, MD;  Location: [facility];  Service: Ophthalmology;  Laterality: Right;  . CESAREAN SECTION  1970  . LASER PHOTO ABLATION Right 10/01/2018   Procedure: LASER PHOTO ABLATION RIGHT EYE;  Surgeon: Hayden Pedro, MD;  Location: [facility];  Service: Ophthalmology;  Laterality: Right;  . MEMBRANE PEEL Right 10/01/2018   Procedure: MEMBRANE PEEL RIGHT EYE;  Surgeon: Hayden Pedro, MD;  Location: [facility];  Service: Ophthalmology;  Laterality: Right;  . TONSILLECTOMY     age 62  . TOTAL KNEE ARTHROPLASTY Right 06/10/2018   Procedure: TOTAL KNEE ARTHROPLASTY;  Surgeon: Paralee Cancel, MD;  [facility];  Service: Orthopedics;  Laterality: Right;  90 mins  . TOTAL KNEE ARTHROPLASTY Left 07/16/2018   Procedure: LEFT TOTAL KNEE ARTHROPLASTY;  Surgeon: Paralee Cancel, MD;  [facility];  Service: Orthopedics;  Laterality: Left;  Adductor Block    MEDICATIONS: . acetaminophen (TYLENOL) 650 MG CR tablet  .  Ascorbic Acid (VITAMIN C) 1000 MG tablet  . Biotin (BIOTIN 5000) 5 MG CAPS  . Calcium Carbonate (CALCIUM 600 PO)  . Cholecalciferol (VITAMIN D3) 5000 units CAPS  . folic acid (FOLVITE) 400 MCG tablet  . HYDROcodone-acetaminophen (NORCO/VICODIN) 5-325 MG tablet  . LUTEIN-ZEAXANTHIN-BILBERRY PO  . Magnesium 500 MG CAPS  . Melatonin 3 MG CAPS  . Menaquinone-7 (VITAMIN K2) 100 MCG CAPS  . methotrexate (RHEUMATREX) 2.5 MG tablet  . Multiple Vitamin (MULTIVITAMIN WITH MINERALS) TABS tablet  . naproxen (NAPROSYN) 500 MG tablet  . Omega-3 Fatty Acids (FISH OIL) 1000 MG CAPS  . polyethylene glycol (MIRALAX / GLYCOLAX) packet   . predniSONE (DELTASONE) 5 MG tablet  . Red Yeast Rice Extract (RED YEAST RICE PO)  . TURMERIC PO  . vitamin E 400 UNIT capsule  . zolpidem (AMBIEN) 5 MG tablet   No current facility-administered medications for this encounter.     Janey Genta [facility] Pre-Surgical Testing [phone] 05/14/19 4:39 PM

## 2019-05-16 ENCOUNTER — Other Ambulatory Visit (HOSPITAL_COMMUNITY)
Admission: RE | Admit: 2019-05-16 | Discharge: 2019-05-16 | Disposition: A | Discharge status: Home / Self Care | Payer: 59 | Source: Ambulatory Visit | Attending: Orthopedic Surgery | Admitting: Orthopedic Surgery

## 2019-05-16 DIAGNOSIS — Z01812 Encounter for preprocedural laboratory examination: Secondary | ICD-10-CM | POA: Diagnosis not present

## 2019-05-16 LAB — SARS CORONAVIRUS 2 (TAT 6-24 HRS): SARS Coronavirus 2: NEGATIVE

## 2019-05-20 ENCOUNTER — Encounter (HOSPITAL_COMMUNITY): Payer: Self-pay | Admitting: Anesthesiology

## 2019-05-20 ENCOUNTER — Inpatient Hospital Stay (HOSPITAL_COMMUNITY): Payer: 59 | Admitting: Anesthesiology

## 2019-05-20 ENCOUNTER — Inpatient Hospital Stay (HOSPITAL_COMMUNITY): Payer: 59

## 2019-05-20 ENCOUNTER — Encounter (HOSPITAL_COMMUNITY)
Admission: RE | Disposition: A | Discharge status: Home / Self Care | Payer: Self-pay | Source: Home / Self Care | Attending: Orthopedic Surgery

## 2019-05-20 ENCOUNTER — Other Ambulatory Visit: Payer: Self-pay

## 2019-05-20 ENCOUNTER — Inpatient Hospital Stay (HOSPITAL_COMMUNITY)
Admission: RE | Admit: 2019-05-20 | Discharge: 2019-05-21 | DRG: 470 | Disposition: A | Discharge status: Home / Self Care | Payer: 59 | Attending: Orthopedic Surgery | Admitting: Orthopedic Surgery

## 2019-05-20 ENCOUNTER — Inpatient Hospital Stay (HOSPITAL_COMMUNITY): Payer: 59 | Admitting: Physician Assistant

## 2019-05-20 DIAGNOSIS — Z79899 Other long term (current) drug therapy: Secondary | ICD-10-CM | POA: Diagnosis not present

## 2019-05-20 DIAGNOSIS — Z8249 Family history of ischemic heart disease and other diseases of the circulatory system: Secondary | ICD-10-CM

## 2019-05-20 DIAGNOSIS — Z87891 Personal history of nicotine dependence: Secondary | ICD-10-CM

## 2019-05-20 DIAGNOSIS — Z96641 Presence of right artificial hip joint: Secondary | ICD-10-CM

## 2019-05-20 DIAGNOSIS — Z79891 Long term (current) use of opiate analgesic: Secondary | ICD-10-CM | POA: Diagnosis not present

## 2019-05-20 DIAGNOSIS — Z803 Family history of malignant neoplasm of breast: Secondary | ICD-10-CM | POA: Diagnosis not present

## 2019-05-20 DIAGNOSIS — M1611 Unilateral primary osteoarthritis, right hip: Principal | ICD-10-CM | POA: Diagnosis present

## 2019-05-20 DIAGNOSIS — E663 Overweight: Secondary | ICD-10-CM | POA: Diagnosis present

## 2019-05-20 DIAGNOSIS — Z96649 Presence of unspecified artificial hip joint: Secondary | ICD-10-CM

## 2019-05-20 DIAGNOSIS — Z96653 Presence of artificial knee joint, bilateral: Secondary | ICD-10-CM | POA: Diagnosis present

## 2019-05-20 DIAGNOSIS — Z791 Long term (current) use of non-steroidal anti-inflammatories (NSAID): Secondary | ICD-10-CM

## 2019-05-20 DIAGNOSIS — Z825 Family history of asthma and other chronic lower respiratory diseases: Secondary | ICD-10-CM | POA: Diagnosis not present

## 2019-05-20 DIAGNOSIS — M069 Rheumatoid arthritis, unspecified: Secondary | ICD-10-CM | POA: Diagnosis present

## 2019-05-20 DIAGNOSIS — Z1159 Encounter for screening for other viral diseases: Secondary | ICD-10-CM | POA: Diagnosis not present

## 2019-05-20 DIAGNOSIS — G5700 Lesion of sciatic nerve, unspecified lower limb: Secondary | ICD-10-CM | POA: Diagnosis present

## 2019-05-20 DIAGNOSIS — Z7952 Long term (current) use of systemic steroids: Secondary | ICD-10-CM | POA: Diagnosis not present

## 2019-05-20 DIAGNOSIS — M5136 Other intervertebral disc degeneration, lumbar region: Secondary | ICD-10-CM | POA: Diagnosis present

## 2019-05-20 DIAGNOSIS — Z419 Encounter for procedure for purposes other than remedying health state, unspecified: Secondary | ICD-10-CM

## 2019-05-20 HISTORY — PX: TOTAL HIP ARTHROPLASTY: SHX124

## 2019-05-20 LAB — TYPE AND SCREEN
ABO/RH(D): O NEG
Antibody Screen: NEGATIVE

## 2019-05-20 SURGERY — ARTHROPLASTY, HIP, TOTAL, ANTERIOR APPROACH
Anesthesia: Spinal | Laterality: Right

## 2019-05-20 MED ORDER — ONDANSETRON HCL 4 MG PO TABS: 4.0000 mg | ORAL_TABLET | Freq: Four times a day (QID) | ORAL | Status: DC | PRN

## 2019-05-20 MED ORDER — ACETAMINOPHEN 325 MG PO TABS: 325.0000 mg | ORAL_TABLET | Freq: Four times a day (QID) | ORAL | Status: DC | PRN

## 2019-05-20 MED ORDER — METOCLOPRAMIDE HCL 5 MG/ML IJ SOLN: 5.0000 mg | Freq: Three times a day (TID) | INTRAMUSCULAR | Status: DC | PRN

## 2019-05-20 MED ORDER — ONDANSETRON HCL 4 MG/2ML IJ SOLN: 4.0000 mg | Freq: Four times a day (QID) | INTRAMUSCULAR | Status: DC | PRN

## 2019-05-20 MED ORDER — TRANEXAMIC ACID-NACL 1000-0.7 MG/100ML-% IV SOLN
1000.0000 mg | Freq: Once | INTRAVENOUS | Status: AC
  Administered 2019-05-20: 1000 mg via INTRAVENOUS
  Filled 2019-05-20: qty 100

## 2019-05-20 MED ORDER — DEXAMETHASONE SODIUM PHOSPHATE 10 MG/ML IJ SOLN
10.0000 mg | Freq: Once | INTRAMUSCULAR | Status: AC
  Administered 2019-05-20: 6 mg via INTRAVENOUS

## 2019-05-20 MED ORDER — TRANEXAMIC ACID-NACL 1000-0.7 MG/100ML-% IV SOLN
1000.0000 mg | INTRAVENOUS | Status: AC
  Administered 2019-05-20: 1000 mg via INTRAVENOUS
  Filled 2019-05-20: qty 100

## 2019-05-20 MED ORDER — PROPOFOL 10 MG/ML IV BOLUS
INTRAVENOUS | Status: AC
  Filled 2019-05-20: qty 60

## 2019-05-20 MED ORDER — FENTANYL CITRATE (PF) 100 MCG/2ML IJ SOLN
INTRAMUSCULAR | Status: DC | PRN
  Administered 2019-05-20 (×2): 50 ug via INTRAVENOUS

## 2019-05-20 MED ORDER — HYDROMORPHONE HCL 1 MG/ML IJ SOLN: 0.2500 mg | INTRAMUSCULAR | Status: DC | PRN

## 2019-05-20 MED ORDER — HYDROCODONE-ACETAMINOPHEN 7.5-325 MG PO TABS: 1.0000 | ORAL_TABLET | ORAL | Status: DC | PRN

## 2019-05-20 MED ORDER — MIDAZOLAM HCL 5 MG/5ML IJ SOLN
INTRAMUSCULAR | Status: DC | PRN
  Administered 2019-05-20: 2 mg via INTRAVENOUS

## 2019-05-20 MED ORDER — METOCLOPRAMIDE HCL 5 MG PO TABS: 5.0000 mg | ORAL_TABLET | Freq: Three times a day (TID) | ORAL | Status: DC | PRN

## 2019-05-20 MED ORDER — POLYETHYLENE GLYCOL 3350 17 G PO PACK
17.0000 g | PACK | Freq: Two times a day (BID) | ORAL | Status: DC
  Administered 2019-05-20 – 2019-05-21 (×2): 17 g via ORAL
  Filled 2019-05-20 (×2): qty 1

## 2019-05-20 MED ORDER — HYDROCODONE-ACETAMINOPHEN 5-325 MG PO TABS
1.0000 | ORAL_TABLET | ORAL | Status: DC | PRN
  Administered 2019-05-20 – 2019-05-21 (×5): 2 via ORAL
  Filled 2019-05-20 (×5): qty 2

## 2019-05-20 MED ORDER — SODIUM CHLORIDE 0.9 % IR SOLN
Status: DC | PRN
  Administered 2019-05-20: 1000 mL

## 2019-05-20 MED ORDER — PROPOFOL 10 MG/ML IV BOLUS
INTRAVENOUS | Status: DC | PRN
  Administered 2019-05-20 (×2): 20 mg via INTRAVENOUS

## 2019-05-20 MED ORDER — EPHEDRINE 5 MG/ML INJ
INTRAVENOUS | Status: AC
  Filled 2019-05-20: qty 10

## 2019-05-20 MED ORDER — OXYCODONE HCL 5 MG/5ML PO SOLN: 5.0000 mg | Freq: Once | ORAL | Status: DC | PRN

## 2019-05-20 MED ORDER — ZOLPIDEM TARTRATE 5 MG PO TABS
5.0000 mg | ORAL_TABLET | Freq: Every day | ORAL | Status: DC
  Administered 2019-05-20: 5 mg via ORAL
  Filled 2019-05-20: qty 1

## 2019-05-20 MED ORDER — DOCUSATE SODIUM 100 MG PO CAPS
100.0000 mg | ORAL_CAPSULE | Freq: Two times a day (BID) | ORAL | Status: DC
  Administered 2019-05-20 – 2019-05-21 (×2): 100 mg via ORAL
  Filled 2019-05-20 (×2): qty 1

## 2019-05-20 MED ORDER — ASPIRIN 81 MG PO CHEW
81.0000 mg | CHEWABLE_TABLET | Freq: Two times a day (BID) | ORAL | Status: DC
  Administered 2019-05-20 – 2019-05-21 (×2): 81 mg via ORAL
  Filled 2019-05-20 (×2): qty 1

## 2019-05-20 MED ORDER — MIDAZOLAM HCL 2 MG/2ML IJ SOLN
INTRAMUSCULAR | Status: AC
  Filled 2019-05-20: qty 2

## 2019-05-20 MED ORDER — EPHEDRINE SULFATE-NACL 50-0.9 MG/10ML-% IV SOSY
PREFILLED_SYRINGE | INTRAVENOUS | Status: DC | PRN
  Administered 2019-05-20 (×2): 5 mg via INTRAVENOUS

## 2019-05-20 MED ORDER — DIPHENHYDRAMINE HCL 12.5 MG/5ML PO ELIX: 12.5000 mg | ORAL_SOLUTION | ORAL | Status: DC | PRN

## 2019-05-20 MED ORDER — PROMETHAZINE HCL 25 MG/ML IJ SOLN: 6.2500 mg | INTRAMUSCULAR | Status: DC | PRN

## 2019-05-20 MED ORDER — METHOCARBAMOL 500 MG PO TABS
500.0000 mg | ORAL_TABLET | Freq: Four times a day (QID) | ORAL | Status: DC | PRN
  Administered 2019-05-21: 500 mg via ORAL
  Filled 2019-05-20: qty 1

## 2019-05-20 MED ORDER — PREDNISONE 5 MG PO TABS
5.0000 mg | ORAL_TABLET | Freq: Every day | ORAL | Status: DC
  Administered 2019-05-21: 09:00:00 5 mg via ORAL
  Filled 2019-05-20: qty 1

## 2019-05-20 MED ORDER — CHLORHEXIDINE GLUCONATE 4 % EX LIQD: 60.0000 mL | Freq: Once | CUTANEOUS | Status: DC

## 2019-05-20 MED ORDER — CEFAZOLIN SODIUM-DEXTROSE 2-4 GM/100ML-% IV SOLN
2.0000 g | Freq: Four times a day (QID) | INTRAVENOUS | Status: AC
  Administered 2019-05-20 – 2019-05-21 (×2): 2 g via INTRAVENOUS
  Filled 2019-05-20 (×2): qty 100

## 2019-05-20 MED ORDER — ALUM & MAG HYDROXIDE-SIMETH 200-200-20 MG/5ML PO SUSP: 15.0000 mL | ORAL | Status: DC | PRN

## 2019-05-20 MED ORDER — MAGNESIUM CITRATE PO SOLN: 1.0000 | Freq: Once | ORAL | Status: DC | PRN

## 2019-05-20 MED ORDER — SODIUM CHLORIDE 0.9 % IV SOLN
INTRAVENOUS | Status: DC
  Administered 2019-05-20: 15:00:00 via INTRAVENOUS

## 2019-05-20 MED ORDER — DEXAMETHASONE SODIUM PHOSPHATE 10 MG/ML IJ SOLN
10.0000 mg | Freq: Once | INTRAMUSCULAR | Status: AC
  Administered 2019-05-21: 09:00:00 10 mg via INTRAVENOUS
  Filled 2019-05-20: qty 1

## 2019-05-20 MED ORDER — FENTANYL CITRATE (PF) 100 MCG/2ML IJ SOLN
INTRAMUSCULAR | Status: AC
  Filled 2019-05-20: qty 2

## 2019-05-20 MED ORDER — OXYCODONE HCL 5 MG PO TABS: 5.0000 mg | ORAL_TABLET | Freq: Once | ORAL | Status: DC | PRN

## 2019-05-20 MED ORDER — BUPIVACAINE IN DEXTROSE 0.75-8.25 % IT SOLN
INTRATHECAL | Status: DC | PRN
  Administered 2019-05-20: 2 mL via INTRATHECAL

## 2019-05-20 MED ORDER — PHENOL 1.4 % MT LIQD: 1.0000 | OROMUCOSAL | Status: DC | PRN

## 2019-05-20 MED ORDER — ONDANSETRON HCL 4 MG/2ML IJ SOLN
INTRAMUSCULAR | Status: DC | PRN
  Administered 2019-05-20: 4 mg via INTRAVENOUS

## 2019-05-20 MED ORDER — MORPHINE SULFATE (PF) 4 MG/ML IV SOLN: 0.5000 mg | INTRAVENOUS | Status: DC | PRN

## 2019-05-20 MED ORDER — CEFAZOLIN SODIUM-DEXTROSE 2-4 GM/100ML-% IV SOLN
2.0000 g | INTRAVENOUS | Status: AC
  Administered 2019-05-20: 2 g via INTRAVENOUS
  Filled 2019-05-20: qty 100

## 2019-05-20 MED ORDER — SODIUM CHLORIDE 0.9 % IV SOLN
INTRAVENOUS | Status: DC | PRN
  Administered 2019-05-20: 20 ug/min via INTRAVENOUS

## 2019-05-20 MED ORDER — BISACODYL 10 MG RE SUPP: 10.0000 mg | Freq: Every day | RECTAL | Status: DC | PRN

## 2019-05-20 MED ORDER — PROPOFOL 500 MG/50ML IV EMUL
INTRAVENOUS | Status: DC | PRN
  Administered 2019-05-20: 85 ug/kg/min via INTRAVENOUS

## 2019-05-20 MED ORDER — LACTATED RINGERS IV SOLN
INTRAVENOUS | Status: DC
  Administered 2019-05-20 (×3): via INTRAVENOUS

## 2019-05-20 MED ORDER — MENTHOL 3 MG MT LOZG: 1.0000 | LOZENGE | OROMUCOSAL | Status: DC | PRN

## 2019-05-20 MED ORDER — FERROUS SULFATE 325 (65 FE) MG PO TABS
325.0000 mg | ORAL_TABLET | Freq: Three times a day (TID) | ORAL | Status: DC
  Administered 2019-05-21: 09:00:00 325 mg via ORAL
  Filled 2019-05-20: qty 1

## 2019-05-20 MED ORDER — METHOCARBAMOL 500 MG IVPB - SIMPLE MED
500.0000 mg | Freq: Four times a day (QID) | INTRAVENOUS | Status: DC | PRN
  Filled 2019-05-20: qty 50

## 2019-05-20 SURGICAL SUPPLY — 45 items
ADH SKN CLS APL DERMABOND .7 (GAUZE/BANDAGES/DRESSINGS) ×1
BLADE SAG 18X100X1.27 (BLADE) ×3 IMPLANT
BLADE SURG SZ10 CARB STEEL (BLADE) ×6 IMPLANT
COVER PERINEAL POST (MISCELLANEOUS) ×3 IMPLANT
COVER SURGICAL LIGHT HANDLE (MISCELLANEOUS) ×3 IMPLANT
COVER WAND RF STERILE (DRAPES) IMPLANT
CUP ACET PINNACLE SECTR 50MM (Hips) IMPLANT
DERMABOND ADVANCED (GAUZE/BANDAGES/DRESSINGS) ×2
DERMABOND ADVANCED .7 DNX12 (GAUZE/BANDAGES/DRESSINGS) ×1 IMPLANT
DRAPE STERI IOBAN 125X83 (DRAPES) ×3 IMPLANT
DRAPE U-SHAPE 47X51 STRL (DRAPES) ×6 IMPLANT
DRESSING AQUACEL AG SP 3.5X10 (GAUZE/BANDAGES/DRESSINGS) ×1 IMPLANT
DRSG AQUACEL AG ADV 3.5X10 (GAUZE/BANDAGES/DRESSINGS) ×2 IMPLANT
DRSG AQUACEL AG SP 3.5X10 (GAUZE/BANDAGES/DRESSINGS) ×3
DURAPREP 26ML APPLICATOR (WOUND CARE) ×3 IMPLANT
ELECT BLADE TIP CTD 4 INCH (ELECTRODE) ×3 IMPLANT
ELECT REM PT RETURN 15FT ADLT (MISCELLANEOUS) ×3 IMPLANT
ELIMINATOR HOLE APEX DEPUY (Hips) ×2 IMPLANT
GLOVE BIO SURGEON STRL SZ 6 (GLOVE) ×6 IMPLANT
GLOVE BIOGEL PI IND STRL 6.5 (GLOVE) ×1 IMPLANT
GLOVE BIOGEL PI IND STRL 8.5 (GLOVE) ×1 IMPLANT
GLOVE BIOGEL PI INDICATOR 6.5 (GLOVE) ×2
GLOVE BIOGEL PI INDICATOR 8.5 (GLOVE) ×2
GLOVE ECLIPSE 8.0 STRL XLNG CF (GLOVE) ×6 IMPLANT
GLOVE ORTHO TXT STRL SZ7.5 (GLOVE) ×6 IMPLANT
GOWN STRL REUS W/TWL LRG LVL3 (GOWN DISPOSABLE) ×6 IMPLANT
GOWN STRL REUS W/TWL XL LVL3 (GOWN DISPOSABLE) ×3 IMPLANT
HEAD FEMORAL 32 CERAMIC (Hips) ×2 IMPLANT
HOLDER FOLEY CATH W/STRAP (MISCELLANEOUS) ×3 IMPLANT
KIT TURNOVER KIT A (KITS) ×2 IMPLANT
LINER ACET PNNCL PLUS4 NEUTRAL (Hips) IMPLANT
PACK ANTERIOR HIP CUSTOM (KITS) ×3 IMPLANT
PINNACLE PLUS 4 NEUTRAL (Hips) ×3 IMPLANT
PINNACLE SECTOR CUP 50MM (Hips) ×3 IMPLANT
SCREW 6.5MMX25MM (Screw) ×2 IMPLANT
STEM FEMORAL SZ5 HIGH ACTIS (Stem) ×2 IMPLANT
SUT MNCRL AB 4-0 PS2 18 (SUTURE) ×3 IMPLANT
SUT STRATAFIX 0 PDS 27 VIOLET (SUTURE) ×3
SUT VIC AB 1 CT1 36 (SUTURE) ×9 IMPLANT
SUT VIC AB 2-0 CT1 27 (SUTURE) ×6
SUT VIC AB 2-0 CT1 TAPERPNT 27 (SUTURE) ×2 IMPLANT
SUTURE STRATFX 0 PDS 27 VIOLET (SUTURE) ×1 IMPLANT
TRAY FOLEY MTR SLVR 14FR STAT (SET/KITS/TRAYS/PACK) ×2 IMPLANT
WATER STERILE IRR 1000ML POUR (IV SOLUTION) ×3 IMPLANT
YANKAUER SUCT BULB TIP 10FT TU (MISCELLANEOUS) IMPLANT

## 2019-05-20 NOTE — Evaluation (Signed)
Physical Therapy Evaluation Patient Details Name: Pamela Pena MRN: [id number] DOB: 07-27-1951 Today's Date: 05/20/2019   History of Present Illness  68 yo female s/p R DA-THA on 05/20/19. PMH includes bilat TKR, neuropathy, RA, lumbar DDD.  Clinical Impression  Pt presents with R hip pain, decreased R hip strength post-surgery, increased time and effort to perform mobility tasks, and decreased activity tolerance. Pt to benefit from acute PT to address deficits. Pt ambulated hallway distance with RW with min guard assist, verbal cuing for form and safety provided. Pt educated on ankle pumps (20/hour) to perform this afternoon/evening to increase circulation, to pt's tolerance and limited by pain. PT to progress mobility as tolerated, and will continue to follow acutely.        Follow Up Recommendations Follow surgeon's recommendation for DC plan and follow-up therapies;Supervision for mobility/OOB    Equipment Recommendations  None recommended by PT    Recommendations for Other Services       Precautions / Restrictions Precautions Precautions: Fall Restrictions Weight Bearing Restrictions: No Other Position/Activity Restrictions: WBAT      Mobility  Bed Mobility Overal bed mobility: Needs Assistance Bed Mobility: Supine to Sit     Supine to sit: Min assist;HOB elevated     General bed mobility comments: Min assist for RLE lifting and translation to EOB, increased time and effort to scoot to EOB.  Transfers Overall transfer level: Needs assistance Equipment used: Rolling walker (2 wheeled) Transfers: Sit to/from Stand Sit to Stand: Min assist;From elevated surface         General transfer comment: Min guard for safety, VC for hand placement when rising.  Ambulation/Gait Ambulation/Gait assistance: Min guard Gait Distance (Feet): 100 Feet Assistive device: Rolling walker (2 wheeled) Gait Pattern/deviations: Step-to pattern;Step-through pattern;Decreased stride  length;Trunk flexed Gait velocity: decr   General Gait Details: Min guard for safety, verbal cuing for placement in RW, sequencing although pt quickly progressing to step-through gait, upright posture.  Stairs            Wheelchair Mobility    Modified Rankin (Stroke Patients Only)       Balance Overall balance assessment: Mild deficits observed, not formally tested                                           Pertinent Vitals/Pain Pain Assessment: 0-10 Pain Score: 6  Pain Location: R hip Pain Descriptors / Indicators: Sore;Burning Pain Intervention(s): Limited activity within patient's tolerance;Monitored during session;Repositioned;Premedicated before session;Ice applied    Home Living Family/patient expects to be discharged to:: Private residence Living Arrangements: Parent(mother) Available Help at Discharge: Family;Available PRN/intermittently Type of Home: House Home Access: Level entry     Home Layout: One level Home Equipment: Walker - 2 wheels;Cane - single point;Toilet riser      Prior Function Level of Independence: Independent               Hand Dominance   Dominant Hand: Right    Extremity/Trunk Assessment   Upper Extremity Assessment Upper Extremity Assessment: Overall WFL for tasks assessed    Lower Extremity Assessment Lower Extremity Assessment: Overall WFL for tasks assessed    Cervical / Trunk Assessment Cervical / Trunk Assessment: Normal  Communication   Communication: No difficulties  Cognition Arousal/Alertness: Awake/alert Behavior During Therapy: WFL for tasks assessed/performed Overall Cognitive Status: Within Functional Limits for tasks assessed  General Comments      Exercises     Assessment/Plan    PT Assessment Patient needs continued PT services  PT Problem List Decreased strength;Decreased mobility;Decreased range of motion;Decreased  activity tolerance;Decreased balance;Decreased knowledge of use of DME;Pain       PT Treatment Interventions DME instruction;Therapeutic activities;Gait training;Therapeutic exercise;Patient/family education;Balance training;Stair training;Functional mobility training    PT Goals (Current goals can be found in the Care Plan section)  Acute Rehab PT Goals Patient Stated Goal: go home with mother PT Goal Formulation: With patient Time For Goal Achievement: 05/27/19 Potential to Achieve Goals: Good    Frequency 7X/week   Barriers to discharge        Co-evaluation               AM-PAC PT "6 Clicks" Mobility  Outcome Measure Help needed turning from your back to your side while in a flat bed without using bedrails?: A Little Help needed moving from lying on your back to sitting on the side of a flat bed without using bedrails?: A Little Help needed moving to and from a bed to a chair (including a wheelchair)?: A Little Help needed standing up from a chair using your arms (e.g., wheelchair or bedside chair)?: A Little Help needed to walk in hospital room?: A Little Help needed climbing 3-5 steps with a railing? : A Lot 6 Click Score: 17    End of Session Equipment Utilized During Treatment: Gait belt Activity Tolerance: Patient tolerated treatment well Patient left: in chair;with call bell/phone within reach;with chair alarm set;with SCD's reapplied Nurse Communication: Mobility status PT Visit Diagnosis: Other abnormalities of gait and mobility (R26.89);Difficulty in walking, not elsewhere classified (R26.2)    Time: 7096-2836 PT Time Calculation (min) (ACUTE ONLY): 22 min   Charges:   PT Evaluation $PT Eval Low Complexity: 1 Low          Natalya Domzalski Terrial Rhodes, PT Acute Rehabilitation Services Pager [phone]  Office [phone]   Tyrone Apple D Despina Hidden 05/20/2019, 5:32 PM

## 2019-05-20 NOTE — Anesthesia Postprocedure Evaluation (Signed)
Anesthesia Post Note  Patient: Pamela Pena  Procedure(s) Performed: TOTAL HIP ARTHROPLASTY ANTERIOR APPROACH (Right )     Patient location during evaluation: PACU Anesthesia Type: Spinal Level of consciousness: oriented and awake and alert Pain management: pain level controlled Vital Signs Assessment: post-procedure vital signs reviewed and stable Respiratory status: spontaneous breathing and respiratory function stable Cardiovascular status: blood pressure returned to baseline and stable Postop Assessment: no headache, no backache and no apparent nausea or vomiting Anesthetic complications: no    Last Vitals:  Vitals:   05/20/19 1400 05/20/19 1428  BP: (!) 156/75 (!) 157/79  Pulse: (!) 59 64  Resp: 12 16  Temp:  36.5 C  SpO2: 100% 100%    Last Pain:  Vitals:   05/20/19 1428  TempSrc: Oral  PainSc:                  Lynda Rainwater

## 2019-05-20 NOTE — Anesthesia Procedure Notes (Signed)
Spinal  Patient location during procedure: OR Start time: 05/20/2019 11:18 AM End time: 05/20/2019 11:18 AM Staffing Resident/CRNA: Lavina Hamman, CRNA Performed: resident/CRNA  Preanesthetic Checklist Completed: patient identified, site marked, surgical consent, pre-op evaluation, timeout performed, IV checked, risks and benefits discussed and monitors and equipment checked Spinal Block Patient position: sitting Prep: DuraPrep Patient monitoring: heart rate, cardiac monitor, continuous pulse ox and blood pressure Approach: midline Location: L3-4 Injection technique: single-shot Needle Needle type: Spinocan  Needle gauge: 24 G Needle length: 9 cm Needle insertion depth: 7 cm Assessment Sensory level: T6 Additional Notes Lot and expiration date OK.  Pt tolerated well.  -heme -para  VSS

## 2019-05-20 NOTE — Interval H&P Note (Signed)
History and Physical Interval Note:  05/20/2019 10:11 AM  Pamela Pena  has presented today for surgery, with the diagnosis of Right hip osteoarthritis.  The various methods of treatment have been discussed with the patient and family. After consideration of risks, benefits and other options for treatment, the patient has consented to  Procedure(s) with comments: TOTAL HIP ARTHROPLASTY ANTERIOR APPROACH (Right) - 70 mins as a surgical intervention.  The patient's history has been reviewed, patient examined, no change in status, stable for surgery.  I have reviewed the patient's chart and labs.  Questions were answered to the patient's satisfaction.     [name]

## 2019-05-20 NOTE — Op Note (Signed)
NAME:  Pamela Pena                ACCOUNT NO.: [id number]      MEDICAL RECORD NO.: [id number]      FACILITY:  [facility]      PHYSICIAN:  Shelda Pal  DATE OF BIRTH:  1951-08-26     DATE OF PROCEDURE:  05/20/2019                                 OPERATIVE REPORT         PREOPERATIVE DIAGNOSIS: Right  hip osteoarthritis.      POSTOPERATIVE DIAGNOSIS:  Right hip osteoarthritis.      PROCEDURE:  Right total hip replacement through an anterior approach   utilizing DePuy THR system, component size 2mm pinnacle cup, a size 32+4 neutral   Altrex liner, a size 5 Hi Actis stem with a 32+1 delta ceramic   ball.      SURGEON:  Madlyn Frankel. Charlann Boxer, M.D.      ASSISTANT:  Lanney Gins, PA-C     ANESTHESIA:  Spinal.      SPECIMENS:  None.      COMPLICATIONS:  None.      BLOOD LOSS:  200 cc     DRAINS:  None.      INDICATION OF THE PROCEDURE:  Pamela Pena is a 68 y.o. female who had   presented to office for evaluation of right hip pain.  Radiographs revealed   progressive degenerative changes with bone-on-bone   articulation of the  hip joint, including subchondral cystic changes and osteophytes.  The patient had painful limited range of   motion significantly affecting their overall quality of life and function.  The patient was failing to    respond to conservative measures including medications and/or injections and activity modification and at this point was ready   to proceed with more definitive measures.  Consent was obtained for   benefit of pain relief.  Specific risks of infection, DVT, component   failure, dislocation, neurovascular injury, and need for revision surgery were reviewed in the office as well discussion of   the anterior versus posterior approach were reviewed.     PROCEDURE IN DETAIL:  The patient was brought to operative theater.   Once adequate anesthesia, preoperative antibiotics, 2 gm of Ancef, 1 gm of Tranexamic Acid, and 10 mg of  Decadron were administered, the patient was positioned supine on the [employer] table.  Once the patient was safely positioned with adequate padding of boney prominences we predraped out the hip, and used fluoroscopy to confirm orientation of the pelvis.      The right hip was then prepped and draped from proximal iliac crest to   mid thigh with a shower curtain technique.      Time-out was performed identifying the patient, planned procedure, and the appropriate extremity.     An incision was then made 2 cm lateral to the   anterior superior iliac spine extending over the orientation of the   tensor fascia lata muscle and sharp dissection was carried down to the   fascia of the muscle.      The fascia was then incised.  The muscle belly was identified and swept   laterally and retractor placed along the superior neck.  Following   cauterization of the circumflex vessels and removing some pericapsular  fat, a second cobra retractor was placed on the inferior neck.  A T-capsulotomy was made along the line of the   superior neck to the trochanteric fossa, then extended proximally and   distally.  Tag sutures were placed and the retractors were then placed   intracapsular.  We then identified the trochanteric fossa and   orientation of my neck cut and then made a neck osteotomy with the femur on traction.  The femoral   head was removed without difficulty or complication.  Traction was let   off and retractors were placed posterior and anterior around the   acetabulum.      The labrum and foveal tissue were debrided.  I began reaming with a 45 mm   reamer and reamed up to 49 mm reamer with good bony bed preparation and a 50 mm  cup was chosen.  The final 50 mm Pinnacle cup was then impacted under fluoroscopy to confirm the depth of penetration and orientation with respect to   Abduction and forward flexion.  A screw was placed into the ilium followed by the hole eliminator.  The final   32+4  neutral Altrex liner was impacted with good visualized rim fit.  The cup was positioned anatomically within the acetabular portion of the pelvis.      At this point, the femur was rolled to 100 degrees.  Further capsule was   released off the inferior aspect of the femoral neck.  I then   released the superior capsule proximally.  With the leg in a neutral position the hook was placed laterally   along the femur under the vastus lateralis origin and elevated manually and then held in position using the hook attachment on the bed.  The leg was then extended and adducted with the leg rolled to 100   degrees of external rotation.  Retractors were placed along the medial calcar and posteriorly over the greater trochanter.  Once the proximal femur was fully   exposed, I used a box osteotome to set orientation.  I then began   broaching with the starting chili pepper broach and passed this by hand and then broached up to 5.  With the 5 broach in place I chose a high offset neck and did several trial reductions.  The offset was appropriate, leg lengths   appeared to be equal best matched with the +1 head ball trial confirmed radiographically.   Given these findings, I went ahead and dislocated the hip, repositioned all   retractors and positioned the right hip in the extended and abducted position.  The final 5 Hi Actis stem was   chosen and it was impacted down to the level of neck cut.  Based on this   and the trial reductions, a final 32+1 delta ceramic ball was chosen and   impacted onto a clean and dry trunnion, and the hip was reduced.  The   hip had been irrigated throughout the case again at this point.  I did   reapproximate the superior capsular leaflet to the anterior leaflet   using #1 Vicryl.  The fascia of the   tensor fascia lata muscle was then reapproximated using #1 Vicryl and #0 Stratafix sutures.  The   remaining wound was closed with 2-0 Vicryl and running 4-0 Monocryl.   The hip  was cleaned, dried, and dressed sterilely using Dermabond and   Aquacel dressing.  The patient was then brought   to recovery room in  stable condition tolerating the procedure well.    Danae Orleans, PA-C was present for the entirety of the case involved from   preoperative positioning, perioperative retractor management, general   facilitation of the case, as well as primary wound closure as assistant.            Pietro Cassis Alvan Dame, M.D.        05/20/2019 11:23 AM

## 2019-05-20 NOTE — Discharge Instructions (Signed)

## 2019-05-20 NOTE — Transfer of Care (Signed)
Immediate Anesthesia Transfer of Care Note  Patient: Pamela Pena  Procedure(s) Performed: Procedure(s) with comments: TOTAL HIP ARTHROPLASTY ANTERIOR APPROACH (Right) - 70 mins  Patient Location: PACU  Anesthesia Type:Spinal  Level of Consciousness:  sedated, patient cooperative and responds to stimulation  Airway & Oxygen Therapy:Patient Spontanous Breathing and Patient connected to face mask oxgen  Post-op Assessment:  Report given to PACU RN and Post -op Vital signs reviewed and stable  Post vital signs:  Reviewed and stable  Last Vitals:  Vitals:   05/20/19 0918  BP: (!) 155/75  Pulse: 71  Resp: 15  Temp: 37.1 C  SpO2: 46%    Complications: No apparent anesthesia complications

## 2019-05-21 ENCOUNTER — Encounter (HOSPITAL_COMMUNITY): Payer: Self-pay | Admitting: Orthopedic Surgery

## 2019-05-21 LAB — CBC
HCT: 36.5 % (ref 36.0–46.0)
Hemoglobin: 11.5 g/dL — ABNORMAL LOW (ref 12.0–15.0)
MCH: 33.4 pg (ref 26.0–34.0)
MCHC: 31.5 g/dL (ref 30.0–36.0)
MCV: 106.1 fL — ABNORMAL HIGH (ref 80.0–100.0)
Platelets: 234 10*3/uL (ref 150–400)
RBC: 3.44 MIL/uL — ABNORMAL LOW (ref 3.87–5.11)
RDW: 13.3 % (ref 11.5–15.5)
WBC: 10.7 10*3/uL — ABNORMAL HIGH (ref 4.0–10.5)
nRBC: 0 % (ref 0.0–0.2)

## 2019-05-21 LAB — BASIC METABOLIC PANEL
Anion gap: 8 (ref 5–15)
BUN: 18 mg/dL (ref 8–23)
CO2: 28 mmol/L (ref 22–32)
Calcium: 8.8 mg/dL — ABNORMAL LOW (ref 8.9–10.3)
Chloride: 105 mmol/L (ref 98–111)
Creatinine, Ser: 0.47 mg/dL (ref 0.44–1.00)
GFR calc Af Amer: >60 mL/min (ref >60)
GFR calc non Af Amer: >60 mL/min (ref >60)
Glucose, Bld: 130 mg/dL — ABNORMAL HIGH (ref 70–99)
Potassium: 4.3 mmol/L (ref 3.5–5.1)
Sodium: 141 mmol/L (ref 135–145)

## 2019-05-21 MED ORDER — POLYETHYLENE GLYCOL 3350 17 G PO PACK: 17.0000 g | PACK | Freq: Two times a day (BID) | ORAL | 0 refills | Status: AC

## 2019-05-21 MED ORDER — ASPIRIN 81 MG PO CHEW: 81.0000 mg | CHEWABLE_TABLET | Freq: Two times a day (BID) | ORAL | 0 refills | Status: AC

## 2019-05-21 MED ORDER — FERROUS SULFATE 325 (65 FE) MG PO TABS: 325.0000 mg | ORAL_TABLET | Freq: Three times a day (TID) | ORAL | 0 refills | Status: DC

## 2019-05-21 MED ORDER — CELECOXIB 200 MG PO CAPS
200.0000 mg | ORAL_CAPSULE | Freq: Every day | ORAL | Status: DC
  Administered 2019-05-21: 10:00:00 200 mg via ORAL
  Filled 2019-05-21: qty 1

## 2019-05-21 MED ORDER — METHOCARBAMOL 500 MG PO TABS: 500.0000 mg | ORAL_TABLET | Freq: Four times a day (QID) | ORAL | 0 refills | Status: DC | PRN

## 2019-05-21 MED ORDER — CELECOXIB 200 MG PO CAPS: 200.0000 mg | ORAL_CAPSULE | Freq: Every day | ORAL | 1 refills | Status: AC

## 2019-05-21 MED ORDER — HYDROCODONE-ACETAMINOPHEN 5-325 MG PO TABS: 1.0000 | ORAL_TABLET | ORAL | 0 refills | Status: AC | PRN

## 2019-05-21 MED ORDER — DOCUSATE SODIUM 100 MG PO CAPS: 100.0000 mg | ORAL_CAPSULE | Freq: Two times a day (BID) | ORAL | 0 refills | Status: DC

## 2019-05-21 NOTE — Progress Notes (Signed)
Physical Therapy Treatment Patient Details Name: Pamela Pena MRN: [id number] DOB: 02-22-1951 Today's Date: 05/21/2019    History of Present Illness 68 yo female s/p R DA-THA on 05/20/19. PMH includes bilat TKR, neuropathy, RA, lumbar DDD.    PT Comments    Patient progressing and able to demonstrate hallway ambulation with RW, HEP and educated on car transfers.  No further acute skilled PT needs, pt stable for d/c home with family/friend support.  RN aware and pt in agreement.    Follow Up Recommendations  Follow surgeon's recommendation for DC plan and follow-up therapies     Equipment Recommendations  None recommended by PT    Recommendations for Other Services       Precautions / Restrictions Precautions Precautions: None Restrictions Weight Bearing Restrictions: No Other Position/Activity Restrictions: WBAT    Mobility  Bed Mobility               General bed mobility comments: up in chair  Transfers Overall transfer level: Needs assistance Equipment used: Rolling walker (2 wheeled) Transfers: Sit to/from Stand Sit to Stand: Supervision         General transfer comment: cues for hand placement  Ambulation/Gait Ambulation/Gait assistance: Supervision Gait Distance (Feet): 150 Feet Assistive device: Rolling walker (2 wheeled) Gait Pattern/deviations: Step-through pattern;Trunk flexed;Decreased stance time - right     General Gait Details: S and initial cues for sequence   Stairs             Wheelchair Mobility    Modified Rankin (Stroke Patients Only)       Balance Overall balance assessment: Mild deficits observed, not formally tested                                          Cognition Arousal/Alertness: Awake/alert Behavior During Therapy: WFL for tasks assessed/performed Overall Cognitive Status: Within Functional Limits for tasks assessed                                        Exercises  Total Joint Exercises Ankle Circles/Pumps: AROM;10 reps;Both;Seated Quad Sets: AROM;Right;10 reps;Seated Short Arc Quad: AROM;Right;10 reps;Seated Heel Slides: AROM;AAROM;Right;10 reps;Seated Hip ABduction/ADduction: AROM;Right;10 reps;Seated Long Arc Quad: AROM;10 reps;Right;Seated    General Comments General comments (skin integrity, edema, etc.): Issued and reveiwed HEP, discussed car transfer and demonstrated; pt toileted and washed hands in bathroom with S      Pertinent Vitals/Pain Pain Score: 4  Pain Location: R hip Pain Descriptors / Indicators: Sore Pain Intervention(s): Repositioned;Ice applied    Home Living                      Prior Function            PT Goals (current goals can now be found in the care plan section) Progress towards PT goals: Progressing toward goals    Frequency    7X/week      PT Plan Current plan remains appropriate    Co-evaluation              AM-PAC PT "6 Clicks" Mobility   Outcome Measure  Help needed turning from your back to your side while in a flat bed without using bedrails?: A Little Help needed moving from lying on your back to sitting  on the side of a flat bed without using bedrails?: A Little Help needed moving to and from a bed to a chair (including a wheelchair)?: A Little Help needed standing up from a chair using your arms (e.g., wheelchair or bedside chair)?: A Little Help needed to walk in hospital room?: A Little Help needed climbing 3-5 steps with a railing? : A Little 6 Click Score: 18    End of Session Equipment Utilized During Treatment: Gait belt Activity Tolerance: Patient tolerated treatment well Patient left: in chair;with call bell/phone within reach   PT Visit Diagnosis: Other abnormalities of gait and mobility (R26.89);Difficulty in walking, not elsewhere classified (R26.2)     Time: 4656-8127 PT Time Calculation (min) (ACUTE ONLY): 29 min  Charges:  $Gait Training: 8-22  mins $Therapeutic Exercise: 8-22 mins                     Magda Kiel, [place] [phone] 05/21/2019    Reginia Naas 05/21/2019, 10:33 AM

## 2019-05-21 NOTE — Plan of Care (Signed)
Plan of care reviewed and discussed with the patient. 

## 2019-05-21 NOTE — Progress Notes (Signed)
     Subjective: 1 Day Post-Op Procedure(s) (LRB): TOTAL HIP ARTHROPLASTY ANTERIOR APPROACH (Right)   Patient reports pain as mild, controlled with with medications.  No reported events throughout the night.  Dr. Alvan Dame discussed the procedure and expectations moving forward.  Ready to be discharged home if they do well with PT.   Objective:   VITALS:   Vitals:   05/21/19 0102 05/21/19 0608  BP: 93/75 (!) 125/51  Pulse: 60 64  Resp: 16 16  Temp: 98.1 F (36.7 C) 98.3 F (36.8 C)  SpO2: 99% 98%    Dorsiflexion/Plantar flexion intact Incision: dressing C/D/I No cellulitis present Compartment soft  LABS Recent Labs    05/21/19 0251  HGB 11.5*  HCT 36.5  WBC 10.7*  PLT 234    Recent Labs    05/21/19 0251  NA 141  K 4.3  BUN 18  CREATININE 0.47  GLUCOSE 130*     Assessment/Plan: 1 Day Post-Op Procedure(s) (LRB): TOTAL HIP ARTHROPLASTY ANTERIOR APPROACH (Right) Foley cath d/c'ed Advance diet Up with therapy D/C IV fluids Discharge home Follow up in 2 weeks at [facility] ([place]). Follow up with OLIN,Sabryn Preslar D in 2 weeks.  Contact information:  [facility]) [address] [place] [phone]    Overweight (BMI 25-29.9) Estimated body mass index is 26.26 kg/m as calculated from the following:   Height as of this encounter: 5\' 5"  (1.651 m).   Weight as of this encounter: 71.6 kg. Patient also counseled that weight may inhibit the healing process Patient counseled that losing weight will help with future health issues      [place] Pugh. Kaylianna Detert   PAC  05/21/2019, 7:59 AM

## 2019-05-22 NOTE — Discharge Summary (Signed)
Physician Discharge Summary  Patient ID: Pamela Pena Thorns MRN: [id number] DOB/AGE: 07-29-1951 68 y.o.  Admit date: 05/20/2019 Discharge date: 05/21/2019   Procedures:  Procedure(s) (LRB): TOTAL HIP ARTHROPLASTY ANTERIOR APPROACH (Right)  Attending Physician:  Dr. Durene RomansMatthew Olin   Admission Diagnoses:   Right hip primary OA / pain  Discharge Diagnoses:  Principal Problem:   S/P right THA, AA Active Problems:   Overweight (BMI 25.0-29.9)  Past Medical History:  Diagnosis Date  . Disc degeneration, lumbar   . Piriformis syndrome    Fiberneuropathy  . RA (rheumatoid arthritis) (HCC)     HPI:    Pamela Pena Gerlich, 68 y.o. female, has a history of pain and functional disability in the right hip(s) due to arthritis and patient has failed non-surgical conservative treatments for greater than 12 weeks to include NSAID's and/or analgesics, corticosteriod injections and activity modification.  Onset of symptoms was gradual starting 1+ years ago with gradually worsening course since that time.The patient noted no past surgery on the right hip(s).  Patient currently rates pain in the right hip at 9 out of 10 with activity. Patient has night pain, worsening of pain with activity and weight bearing, trendelenberg gait, pain that interfers with activities of daily living and pain with passive range of motion. Patient has evidence of periarticular osteophytes and joint space narrowing by imaging studies. This condition presents safety issues increasing the risk of falls.  There is no current active infection. Risks, benefits and expectations were discussed with the patient.  Risks including but not limited to the risk of anesthesia, blood clots, nerve damage, blood vessel damage, failure of the prosthesis, infection and up to and including death.  Patient understand the risks, benefits and expectations and wishes to proceed with surgery.   PCP: Merri BrunettePharr, Walter, MD   Discharged Condition: good  Hospital  Course:  Patient underwent the above stated procedure on 05/20/2019. Patient tolerated the procedure well and brought to the recovery room in good condition and subsequently to the floor.  POD #1 BP: 125/51 ; Pulse: 64 ; Temp: 98.3 F (36.8 Pena) ; Resp: 16 Patient reports pain as mild, controlled with with medications.  No reported events throughout the night.  Dr. Charlann Boxerlin discussed the procedure and expectations moving forward.  Ready to be discharged home. Dorsiflexion/plantar flexion intact, incision: dressing Pena/D/I, no cellulitis present and compartment soft.   LABS  Basename    HGB     11.5  HCT     36.5    Discharge Exam: General appearance: alert, cooperative and no distress Extremities: Homans sign is negative, no sign of DVT, no edema, redness or tenderness in the calves or thighs and no ulcers, gangrene or trophic changes  Disposition:  Home with follow up in 2 weeks   Follow-up Information    Durene Romanslin, Zora Glendenning, MD. Schedule an appointment as soon as possible for a visit in 2 weeks.   Specialty: Orthopedic Surgery Contact information: [address] [place] [id number] [phone]           Discharge Instructions    Call MD / Call 911   Complete by: As directed    If you experience chest pain or shortness of breath, CALL 911 and be transported to the hospital emergency room.  If you develope a fever above 101 F, pus (white drainage) or increased drainage or redness at the wound, or calf pain, call your surgeon's office.   Change dressing   Complete by: As  directed    Maintain surgical dressing until follow up in the clinic. If the edges start to pull up, may reinforce with tape. If the dressing is no longer working, may remove and cover with gauze and tape, but must keep the area dry and clean.  Call with any questions or concerns.   Constipation Prevention   Complete by: As directed    Drink plenty of fluids.  Prune juice may be helpful.  You may use a  stool softener, such as Colace (over the counter) 100 mg twice a day.  Use MiraLax (over the counter) for constipation as needed.   Diet - low sodium heart healthy   Complete by: As directed    Discharge instructions   Complete by: As directed    Maintain surgical dressing until follow up in the clinic. If the edges start to pull up, may reinforce with tape. If the dressing is no longer working, may remove and cover with gauze and tape, but must keep the area dry and clean.  Follow up in 2 weeks at [facility] ([place]). Call with any questions or concerns.   Increase activity slowly as tolerated   Complete by: As directed    Weight bearing as tolerated with assist device (walker, cane, etc) as directed, use it as long as suggested by your surgeon or therapist, typically at least 4-6 weeks.   TED hose   Complete by: As directed    Use stockings (TED hose) for 2 weeks on both leg(s).  You may remove them at night for sleeping.      Allergies as of 05/21/2019   No Known Allergies     Medication List    STOP taking these medications   acetaminophen 650 MG CR tablet Commonly known as: TYLENOL   naproxen 500 MG tablet Commonly known as: NAPROSYN     TAKE these medications   aspirin 81 MG chewable tablet Commonly known as: Aspirin Childrens Chew 1 tablet (81 mg total) by mouth 2 (two) times daily for 30 days. Take for 4 weeks, then resume regular dose.   Biotin 5000 5 MG Caps Generic drug: Biotin Take 5 mg by mouth daily.   CALCIUM 600 PO Take 600 mg by mouth 2 (two) times daily.   celecoxib 200 MG capsule Commonly known as: CeleBREX Take 1 capsule (200 mg total) by mouth daily.   docusate sodium 100 MG capsule Commonly known as: Colace Take 1 capsule (100 mg total) by mouth 2 (two) times daily.   ferrous sulfate 325 (65 FE) MG tablet Commonly known as: FerrouSul Take 1 tablet (325 mg total) by mouth 3 (three) times daily with meals for 14 days.   Fish Oil 1000 MG Caps  Take 1,000 mg by mouth 2 (two) times daily.   folic acid 989 MCG tablet Commonly known as: FOLVITE Take 400 mcg by mouth daily.   HYDROcodone-acetaminophen 5-325 MG tablet Commonly known as: Norco Take 1-2 tablets by mouth every 4 (four) hours as needed for moderate pain or severe pain. What changed:   how much to take  when to take this  reasons to take this   LUTEIN-ZEAXANTHIN-BILBERRY PO Take 1 capsule by mouth daily. HERBAVISION   Magnesium 500 MG Caps Take 500 mg by mouth every evening.   Melatonin 3 MG Caps Take 3 mg by mouth at bedtime.   methocarbamol 500 MG tablet Commonly known as: Robaxin Take 1 tablet (500 mg total) by mouth every 6 (six) hours as needed  for muscle spasms.   methotrexate 2.5 MG tablet Commonly known as: RHEUMATREX Take 1 tablet (2.5 mg total) by mouth 2 (two) times a week. Caution:Chemotherapy. Protect from light.  Takes 6 tablets per week , 3 tablets in the morning and 3 at HS   HOLD FOR 2 WEEKS AFTER SURGERY. What changed:   when to take this  additional instructions   multivitamin with minerals Tabs tablet Take 1 tablet by mouth daily.   polyethylene glycol 17 g packet Commonly known as: MIRALAX / GLYCOLAX Take 17 g by mouth 2 (two) times daily. What changed: when to take this   predniSONE 5 MG tablet Commonly known as: DELTASONE Take 5 mg by mouth daily.   RED YEAST RICE PO Take 500 mg by mouth 2 (two) times a day.   TURMERIC PO Take 1,800 mg by mouth 2 (two) times daily.   vitamin Pena 1000 MG tablet Take 1,000 mg by mouth 2 (two) times daily.   Vitamin D3 125 MCG (5000 UT) Caps Take 5,000 Units by mouth daily.   vitamin E 400 UNIT capsule Take 400 Units by mouth 2 (two) times a day.   Vitamin K2 100 MCG Caps Take 100 mcg by mouth daily.   zolpidem 5 MG tablet Commonly known as: AMBIEN Take 5 mg by mouth at bedtime.            Discharge Care Instructions  (From admission, onward)         Start      Ordered   05/21/19 0000  Change dressing    Comments: Maintain surgical dressing until follow up in the clinic. If the edges start to pull up, may reinforce with tape. If the dressing is no longer working, may remove and cover with gauze and tape, but must keep the area dry and clean.  Call with any questions or concerns.   05/21/19 1287           Signed: Anastasio Auerbach. Billee Balcerzak   PA-Pena  05/22/2019, 8:26 AM

## 2019-05-27 ENCOUNTER — Ambulatory Visit: Payer: 59 | Admitting: Neurology

## 2019-12-03 ENCOUNTER — Encounter: Payer: Self-pay | Admitting: Neurology

## 2019-12-03 ENCOUNTER — Telehealth: Payer: Self-pay

## 2019-12-04 ENCOUNTER — Other Ambulatory Visit: Payer: Self-pay

## 2019-12-04 ENCOUNTER — Telehealth (INDEPENDENT_AMBULATORY_CARE_PROVIDER_SITE_OTHER): Payer: 59 | Admitting: Neurology

## 2019-12-04 VITALS — BP 175/81 | Ht 66.0 in | Wt 160.0 lb

## 2019-12-04 DIAGNOSIS — M0550 Rheumatoid polyneuropathy with rheumatoid arthritis of unspecified site: Secondary | ICD-10-CM

## 2019-12-04 NOTE — Progress Notes (Signed)
Virtual Visit via Video Note The purpose of this virtual visit is to provide medical care while limiting exposure to the novel coronavirus.    Consent was obtained for video visit:  Yes.   Answered questions that patient had about telehealth interaction:  Yes.   I discussed the limitations, risks, security and privacy concerns of performing an evaluation and management service by telemedicine. I also discussed with the patient that there may be a patient responsible charge related to this service. The patient expressed understanding and agreed to proceed.  Pt location: Home Physician Location: office Name of referring provider:  Deland Pretty, MD I connected with Pamela Pena at patients initiation/request on 12/04/2019 at  7:50 AM EST by video enabled telemedicine application and verified that I am speaking with the correct person using two identifiers. Pt MRN:  [id number] Pt DOB:  May 12, 1951 Video Participants:  Pamela Pena   History of Present Illness: This is a 69 y.o. female returning for follow-up of peripheral neuropathy in the setting of rheumatoid arthritis.  Over the past year, she feels that her neuropathy is gradually getting worse, such that the numbness is more intense and she is starting to have stabbing and shooting pain in the feet, especially at nighttime.  Symptoms are more so bothersome than limiting her activity.  She remains highly independent and continues to work, although is contemplating on retirement over the next 2 years.  She walks unassisted and has not suffered any falls, but does report some imbalance on uneven ground.  She takes hydrocodone for arthritic pain and does not wish to be on anything else for her neuropathic pain.    Observations/Objective:   Vitals:   12/03/19 1037  BP: (!) 175/81  Weight: 160 lb (72.6 kg)  Height: 5\' 6"  (1.676 m)   Patient is awake, alert, and appears comfortable.  Oriented x 4.   Face is symmetric.  No ptosis. Speech is  not dysarthric.  Language is intact  Antigravity in all extremities.   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  She has progression of neuropathy expected with the disease course.  Rheumatoid arthritis is well controlled on methotrexate and corticosteroids.  I offered medication for neuropathic pain, but given symptoms are only at nighttime and she already takes hydrocodone, patient declined.  We again discussed electrodiagnostic testing, noting that it would only be diagnostic, and given classic history and exam for neuropathy, it likely would not change management.  It was mutually decided to hold off on testing.    She had many questions about natural course, prognosis and management options.  Unfortunately, there is no effective treatment to cure neuropathy and management is supportive.  In the past, she reports having elevated vitamin B6 level.  Pyridoxine toxicity can cause neuropathy, however this tends to directly affect the dorsal root ganglion causing severe imbalance and lightheadedness, more so than her symptoms of distal numbness.  I have no objection to having her repeat vitamin B6 levels, she can have this done with her next set of blood work with her primary care doctor's office.  For spells of severe pain, she can try over-the-counter lidocaine ointment to her feet as needed.    Follow Up Instructions:   I discussed the assessment and treatment plan with the patient. The patient was provided an opportunity to ask questions and all were answered. The patient agreed with the plan and demonstrated an understanding of the instructions.  The patient was advised to call back or seek an in-person evaluation if the symptoms worsen or if the condition fails to improve as anticipated.  Follow-up in 1 year   Glendale Chard, DO

## 2019-12-15 ENCOUNTER — Telehealth: Payer: 59 | Admitting: Neurology

## 2020-01-23 ENCOUNTER — Encounter (INDEPENDENT_AMBULATORY_CARE_PROVIDER_SITE_OTHER): Payer: 59 | Admitting: Ophthalmology

## 2020-02-12 ENCOUNTER — Encounter (INDEPENDENT_AMBULATORY_CARE_PROVIDER_SITE_OTHER): Payer: 59 | Admitting: Ophthalmology

## 2020-02-12 DIAGNOSIS — H43813 Vitreous degeneration, bilateral: Secondary | ICD-10-CM

## 2020-02-12 DIAGNOSIS — H35371 Puckering of macula, right eye: Secondary | ICD-10-CM

## 2020-02-12 DIAGNOSIS — H33303 Unspecified retinal break, bilateral: Secondary | ICD-10-CM | POA: Diagnosis not present

## 2020-02-13 ENCOUNTER — Other Ambulatory Visit: Payer: Self-pay | Admitting: Internal Medicine

## 2020-02-13 DIAGNOSIS — Z1231 Encounter for screening mammogram for malignant neoplasm of breast: Secondary | ICD-10-CM

## 2020-02-19 ENCOUNTER — Other Ambulatory Visit: Payer: Self-pay

## 2020-02-19 ENCOUNTER — Ambulatory Visit
Admission: RE | Admit: 2020-02-19 | Discharge: 2020-02-19 | Disposition: A | Discharge status: Home / Self Care | Payer: 59 | Source: Ambulatory Visit

## 2020-02-19 DIAGNOSIS — Z1231 Encounter for screening mammogram for malignant neoplasm of breast: Secondary | ICD-10-CM

## 2020-09-29 ENCOUNTER — Encounter (INDEPENDENT_AMBULATORY_CARE_PROVIDER_SITE_OTHER): Payer: Medicare Other | Admitting: Ophthalmology

## 2020-09-30 ENCOUNTER — Telehealth: Payer: 59 | Admitting: Neurology

## 2020-10-15 ENCOUNTER — Encounter (INDEPENDENT_AMBULATORY_CARE_PROVIDER_SITE_OTHER): Payer: 59 | Admitting: Ophthalmology

## 2020-10-15 ENCOUNTER — Other Ambulatory Visit: Payer: Self-pay

## 2020-10-15 DIAGNOSIS — H35371 Puckering of macula, right eye: Secondary | ICD-10-CM | POA: Diagnosis not present

## 2020-10-15 DIAGNOSIS — H43812 Vitreous degeneration, left eye: Secondary | ICD-10-CM | POA: Diagnosis not present

## 2020-10-15 DIAGNOSIS — H33303 Unspecified retinal break, bilateral: Secondary | ICD-10-CM

## 2020-10-18 ENCOUNTER — Encounter (INDEPENDENT_AMBULATORY_CARE_PROVIDER_SITE_OTHER): Payer: 59 | Admitting: Ophthalmology

## 2020-11-05 ENCOUNTER — Ambulatory Visit: Payer: 59 | Admitting: Neurology

## 2021-02-17 ENCOUNTER — Encounter (INDEPENDENT_AMBULATORY_CARE_PROVIDER_SITE_OTHER): Payer: Medicare Other | Admitting: Ophthalmology

## 2021-02-24 ENCOUNTER — Encounter (INDEPENDENT_AMBULATORY_CARE_PROVIDER_SITE_OTHER): Payer: Medicare Other | Admitting: Ophthalmology

## 2021-09-17 IMAGING — MG DIGITAL SCREENING BILAT W/ TOMO W/ CAD
8 series · 8 of 24 positions shown · non-contrast
Comparison: Previous exam(s).

CLINICAL DATA: Screening.

EXAM:
DIGITAL SCREENING BILATERAL MAMMOGRAM WITH TOMO AND CAD

[R MLO synth-2D]
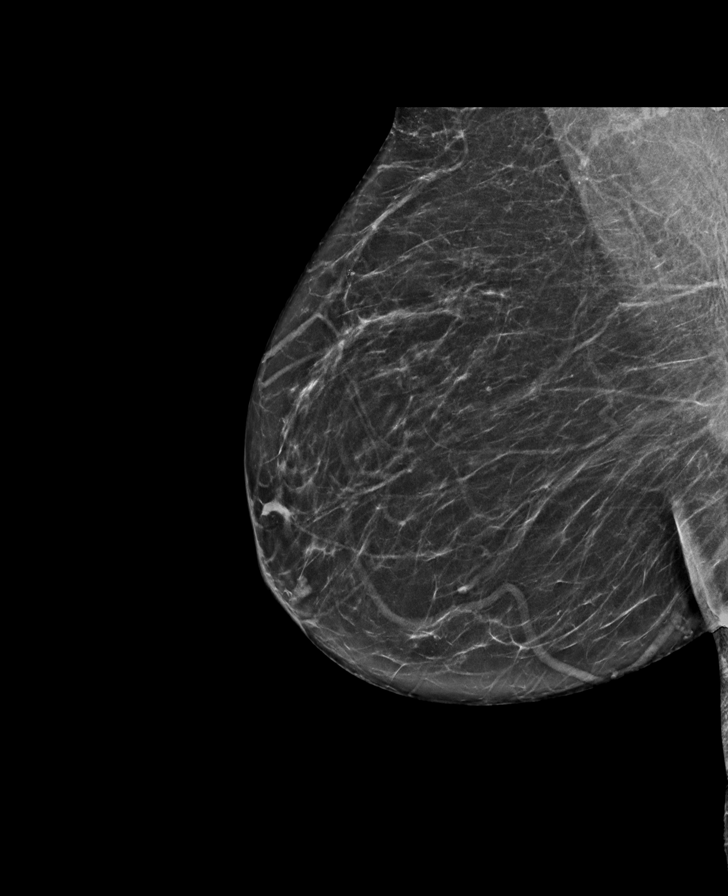

[L MLO synth-2D]
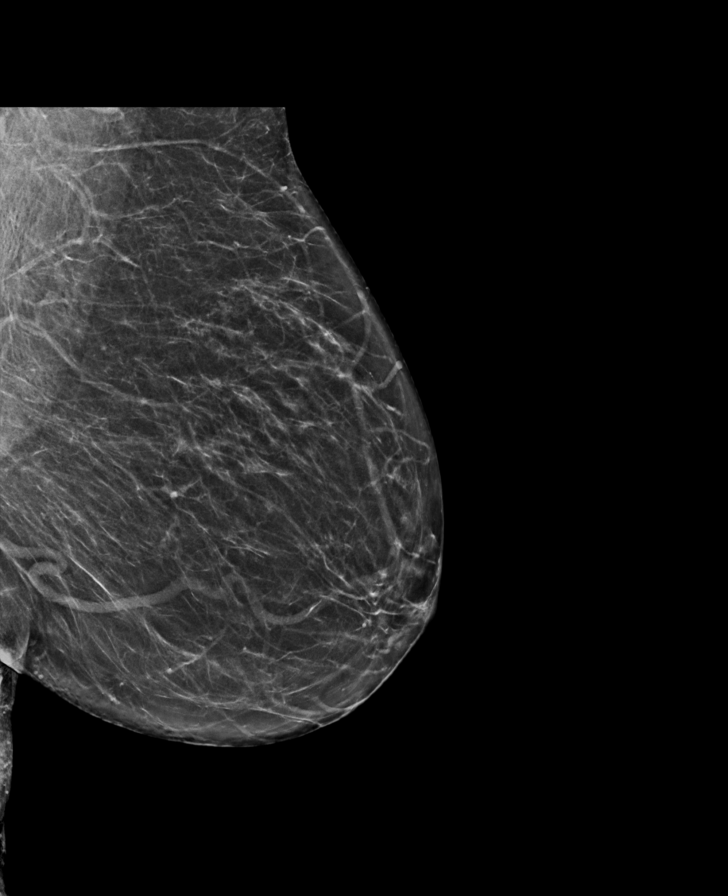

[R CC synth-2D]
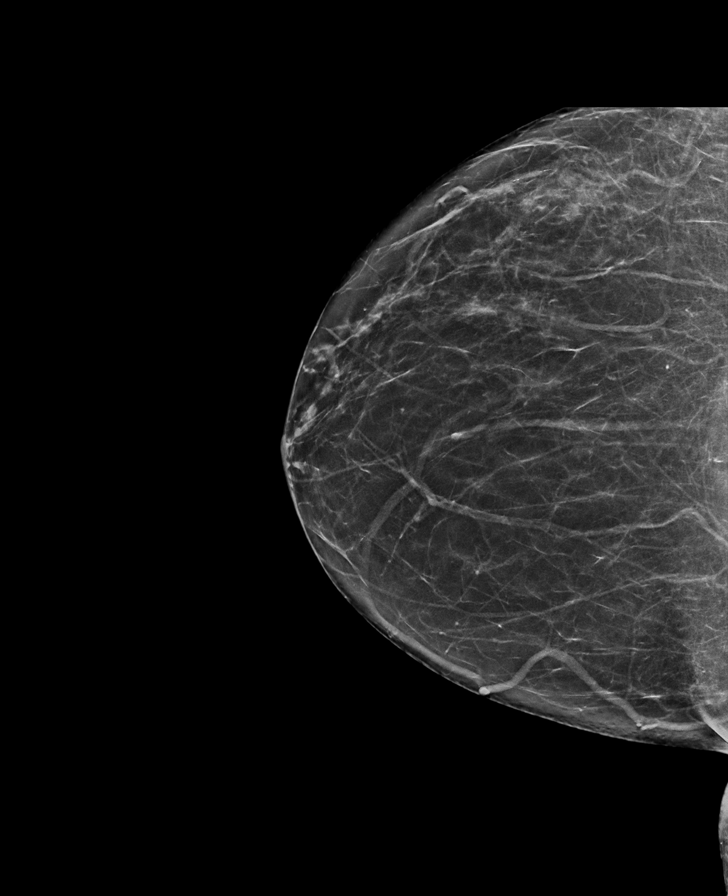

[L CC synth-2D]
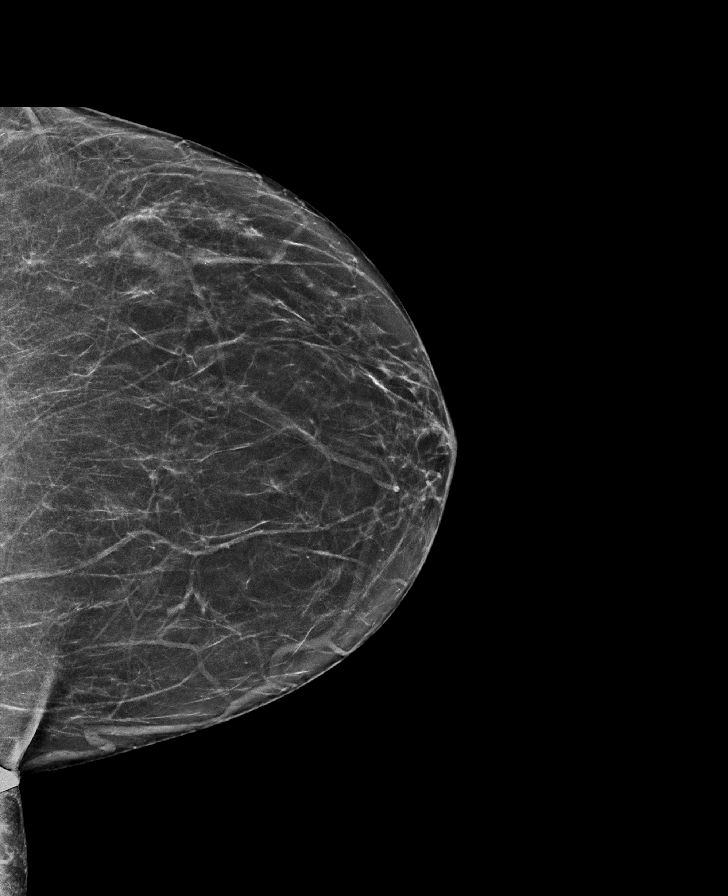

[R CC tomo · tomo slice 33/65.0]
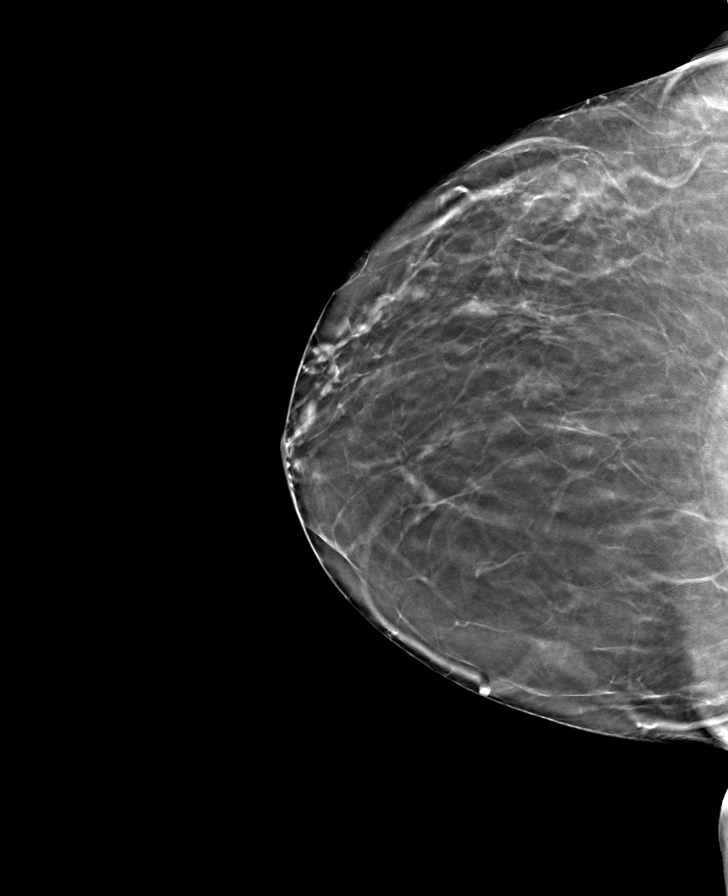

[R MLO tomo · tomo slice 34/67.0]
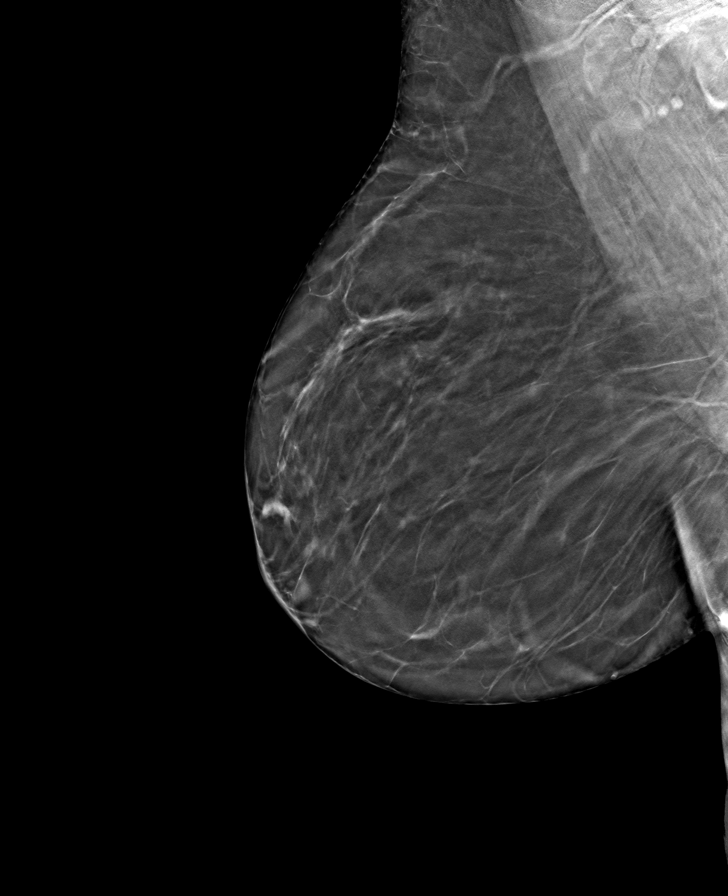

[L CC tomo · tomo slice 31/60.0]
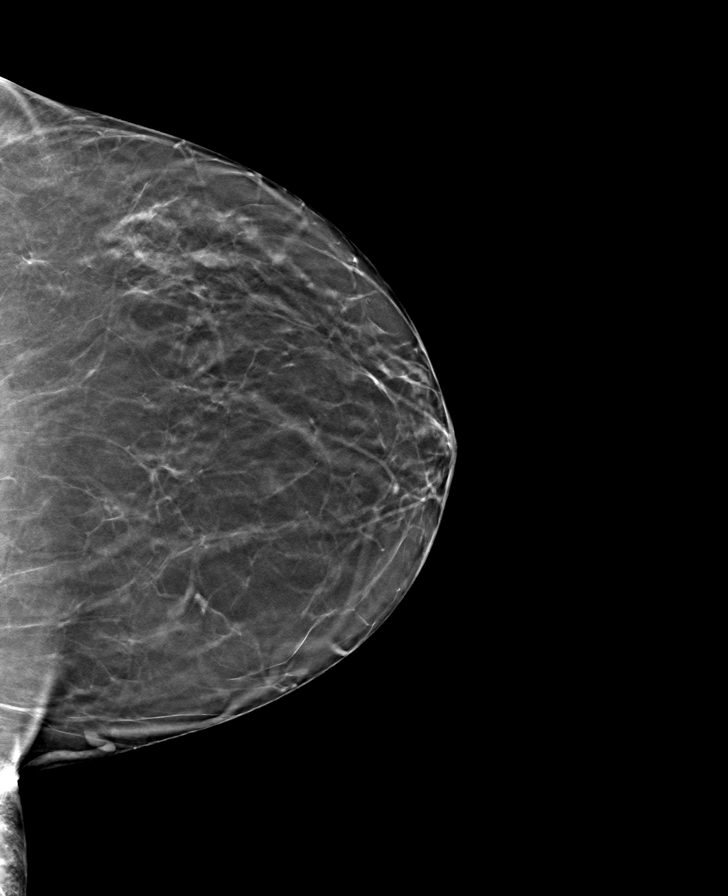

[L MLO tomo · tomo slice 33/66.0]
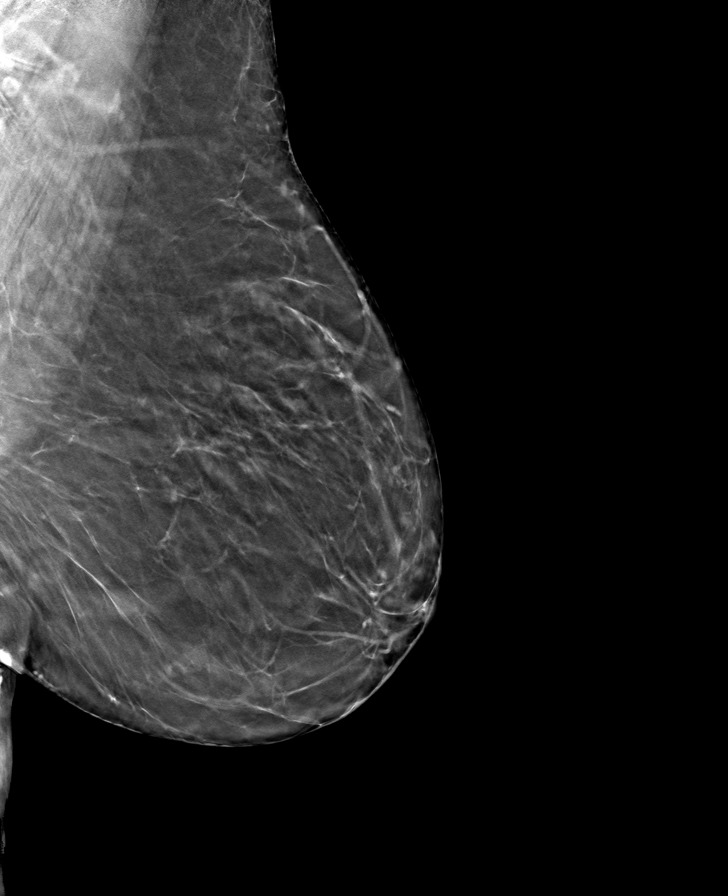

[8 of 24 positions shown; findings below may reference images not displayed]

ACR Breast Density Category b: There are scattered areas of
fibroglandular density.
FINDINGS: There are no findings suspicious for malignancy. Images were
processed with CAD.
IMPRESSION: No mammographic evidence of malignancy. A result letter of this
screening mammogram will be mailed directly to the patient.

RECOMMENDATION:
Screening mammogram in one year. (Code:[id number])

BI-RADS CATEGORY  1: Negative.
